# Patient Record
Sex: Female | Born: 1990 | Race: White | Hispanic: No | Marital: Married | Smoking: Never smoker
Health system: Southern US, Community
[De-identification: ages and names within clinical notes are randomized; demographics above are authoritative.]

## PROBLEM LIST (undated history)

## (undated) DIAGNOSIS — Z789 Other specified health status: Secondary | ICD-10-CM

---

## 2013-05-20 HISTORY — PX: WISDOM TOOTH EXTRACTION: SHX21

## 2016-06-03 DIAGNOSIS — Z98891 History of uterine scar from previous surgery: Secondary | ICD-10-CM | POA: Insufficient documentation

## 2018-05-20 NOTE — L&D Delivery Note (Signed)
Delivery Note Pt became complete at 0703 and labored down until 0830 prior to pushing. At 9:03 AM a viable female was delivered en caul with rupture simultaneous with delivery for MSF, via VBAC, Spontaneous (Presentation: ROA).  APGAR: 8, 9; weight: 3715gm (8lb 3oz).   Placenta status: spont, intact.  Cord: 3 vessel  Anesthesia:  Epidural Episiotomy: None Lacerations: 2nd degree;Vaginal;Labial Suture Repair: 3.0 monocryl Est. Blood Loss (mL): 743 (mostly from the lacerations, not uterine atony)  Mom to postpartum.  Baby to Couplet care / Skin to Skin.  Myrtis Ser CNM 02/18/2019, 9:44 AM  Please schedule this patient for Postpartum visit in: 4 weeks- can be virtual with the following provider: Any provider For C/S patients schedule nurse incision check in weeks 2 weeks: no High risk pregnancy complicated by: TOLAC Delivery mode:  SVD Anticipated Birth Control:  other/unsure PP Procedures needed: none  Schedule Integrated BH visit: no

## 2018-07-23 ENCOUNTER — Other Ambulatory Visit: Payer: Self-pay

## 2018-07-23 ENCOUNTER — Encounter: Payer: Self-pay | Admitting: Family Medicine

## 2018-07-23 ENCOUNTER — Ambulatory Visit (INDEPENDENT_AMBULATORY_CARE_PROVIDER_SITE_OTHER): Payer: Medicaid Other | Admitting: *Deleted

## 2018-07-23 DIAGNOSIS — Z3201 Encounter for pregnancy test, result positive: Secondary | ICD-10-CM

## 2018-07-23 DIAGNOSIS — Z3200 Encounter for pregnancy test, result unknown: Secondary | ICD-10-CM

## 2018-07-23 LAB — POCT PREGNANCY, URINE: Preg Test, Ur: POSITIVE — AB

## 2018-07-23 NOTE — Progress Notes (Signed)
Here for pregnancy test which was positive. LMP was 05/17/18 which makes her [redacted]w[redacted]d with EDD 02/21/19. Is sure of her period date and has had regular periods last few months.  She is already taking prenatal vitamins. Advised her to start prenatal care. - she is possible interested in our office or [place] office- sent to registrar to schedule.  Heydi Swango,RN

## 2018-07-24 NOTE — Progress Notes (Signed)
I have reviewed this chart and agree with the RN/CMA assessment and management.    K. Meryl Santina Trillo, M.D. Attending [facility] (Faculty Practice)   

## 2018-08-04 ENCOUNTER — Ambulatory Visit (INDEPENDENT_AMBULATORY_CARE_PROVIDER_SITE_OTHER): Payer: Medicaid Other | Admitting: Advanced Practice Midwife

## 2018-08-04 ENCOUNTER — Other Ambulatory Visit: Payer: Self-pay

## 2018-08-04 ENCOUNTER — Encounter: Payer: Self-pay | Admitting: Advanced Practice Midwife

## 2018-08-04 ENCOUNTER — Other Ambulatory Visit (HOSPITAL_COMMUNITY)
Admission: RE | Admit: 2018-08-04 | Discharge: 2018-08-04 | Disposition: A | Discharge status: Home / Self Care | Payer: Medicaid Other | Source: Ambulatory Visit | Attending: Obstetrics & Gynecology | Admitting: Obstetrics & Gynecology

## 2018-08-04 DIAGNOSIS — Z98891 History of uterine scar from previous surgery: Secondary | ICD-10-CM | POA: Insufficient documentation

## 2018-08-04 DIAGNOSIS — Z3481 Encounter for supervision of other normal pregnancy, first trimester: Secondary | ICD-10-CM

## 2018-08-04 DIAGNOSIS — Z3480 Encounter for supervision of other normal pregnancy, unspecified trimester: Secondary | ICD-10-CM | POA: Insufficient documentation

## 2018-08-04 DIAGNOSIS — Z3A11 11 weeks gestation of pregnancy: Secondary | ICD-10-CM | POA: Diagnosis not present

## 2018-08-04 DIAGNOSIS — O0993 Supervision of high risk pregnancy, unspecified, third trimester: Secondary | ICD-10-CM | POA: Insufficient documentation

## 2018-08-04 LAB — POCT URINALYSIS DIPSTICK OB
Blood, UA: NEGATIVE
Glucose, UA: NEGATIVE
Ketones, UA: NEGATIVE
Nitrite, UA: NEGATIVE
Spec Grav, UA: 1.015 (ref 1.010–1.025)
pH, UA: 6 (ref 5.0–8.0)

## 2018-08-04 NOTE — Progress Notes (Signed)
DATING AND VIABILITY SONOGRAM   Rebecca [name] is a 28 y.o. year old G2P1001 with LMP Patient's last menstrual period was 05/17/2018 (exact date). which would correlate to  [redacted]w[redacted]d weeks gestation.  She has regular menstrual cycles.   She is here today for a confirmatory initial sonogram.    GESTATION: SINGLETON     FETAL ACTIVITY:          Heart rate       166          The fetus is active.    ADNEXA: The ovaries are normal.   GESTATIONAL AGE AND  BIOMETRICS:  Gestational criteria: Estimated Date of Delivery: 02/21/19 by LMP now at [redacted]w[redacted]d  Previous Scans:0      CROWN RUMP LENGTH           4.52 cm         11-2 weeks                                                                               AVERAGE EGA(BY THIS SCAN):  11-2 weeks  WORKING EDD( LMP ):  02-21-2019     TECHNICIAN COMMENTS: Patient informed that the ultrasound is considered a limited obstetric ultrasound and is not intended to be a complete ultrasound exam. Patient also informed that the ultrasound is not being completed with the intent of assessing for fetal or placental anomalies or any pelvic abnormalities. Explained that the purpose of today's ultrasound is to assess for fetal heart rate. Patient acknowledges the purpose of the exam and the limitations of the study.    Armandina Stammer 08/04/2018 10:18 AM

## 2018-08-04 NOTE — Progress Notes (Signed)
  Subjective:    Rebecca [name] is a G2P1001 [redacted]w[redacted]d being seen today for her first obstetrical visit.  Her obstetrical history is significant for History of Low Transverse Cesarean Delivery first pregnancy for FTP past 5cm.  Wants Trial of labor after Cesarean.  States got to 5cm and cervix swelled up.  States did not know she could have refused C/S at the time.   Patient does intend to breast feed. Pregnancy history fully reviewed.  Patient reports no complaints.  Vitals:   08/04/18 0940 08/04/18 0951  BP: 111/80   Pulse: 81   Weight: 64 kg   Height:  5\' 6"  (1.676 m)    HISTORY: OB History  Gravida Para Term Preterm AB Living  2 1 1     1   SAB TAB Ectopic Multiple Live Births          1    # Outcome Date GA Lbr Len/2nd Weight Sex Delivery Anes PTL Lv  2 Current           1 Term 2017 [redacted]w[redacted]d   F CS-LTranv EPI N LIV   History reviewed. No pertinent past medical history. Past Surgical History:  Procedure Laterality Date  . CESAREAN SECTION     Family History  Problem Relation Age of Onset  . Hypertension Mother   . Pulmonary embolism Father   . Hypertension Father      Exam    Uterus:     Pelvic Exam:    Perineum: No Hemorrhoids, Normal Perineum   Vulva: Bartholin's, Urethra, Skene's normal   Vagina:  normal mucosa, normal discharge   pH:    Cervix: no bleeding following Pap   Adnexa: no mass, fullness, tenderness   Bony Pelvis: gynecoid  System: Breast:  normal appearance, no masses or tenderness   Skin: normal coloration and turgor, no rashes    Neurologic: oriented, grossly non-focal   Extremities: normal strength, tone, and muscle mass   HEENT neck supple with midline trachea   Mouth/Teeth mucous membranes moist, pharynx normal without lesions   Neck supple and no masses   Cardiovascular: regular rate and rhythm   Respiratory:  appears well, vitals normal, no respiratory distress, acyanotic, normal RR, ear and throat exam is normal, neck free of mass or  lymphadenopathy, chest clear, no wheezing, crepitations, rhonchi, normal symmetric air entry   Abdomen: soft, non-tender; bowel sounds normal; no masses,  no organomegaly   Urinary: urethral meatus normal      Assessment:    Pregnancy: G2P1001 Patient Active Problem List   Diagnosis Date Noted  . Supervision of other normal pregnancy, antepartum 08/04/2018  . Hx of cesarean section 08/04/2018        Plan:     Initial labs drawn. Prenatal vitamins. Problem list reviewed and updated. Genetic Screening discussed First Screen: Wants to do [place].  Ultrasound discussed; fetal survey: requested.  Follow up in 4 weeks. 50% of 30 min visit spent on counseling and coordination of care.    Welcomed to practice Reviewed nature of practice, [facility] and [facility] Prefers CNM care Wants to do [place].  Discussed TOLAC is a contraindication.  Would like low-intervention birth.  Wants to try TOLAC this pregnancy Routines reviewed   Wynelle Bourgeois 08/04/2018

## 2018-08-04 NOTE — Patient Instructions (Signed)
First Trimester of Pregnancy  The first trimester of pregnancy is from week 1 until the end of week 13 (months 1 through 3). A week after a sperm fertilizes an egg, the egg will implant on the wall of the uterus. This embryo will begin to develop into a baby. Genes from you and your partner will form the baby. The female genes will determine whether the baby will be a boy or a girl. At 6-8 weeks, the eyes and face will be formed, and the heartbeat can be seen on ultrasound. At the end of 12 weeks, all the baby's organs will be formed.  Now that you are pregnant, you will want to do everything you can to have a healthy baby. Two of the most important things are to get good prenatal care and to follow your health care provider's instructions. Prenatal care is all the medical care you receive before the baby's birth. This care will help prevent, find, and treat any problems during the pregnancy and childbirth.  Body changes during your first trimester  Your body goes through many changes during pregnancy. The changes vary from woman to woman.   You may gain or lose a couple of pounds at first.   You may feel sick to your stomach (nauseous) and you may throw up (vomit). If the vomiting is uncontrollable, call your health care provider.   You may tire easily.   You may develop headaches that can be relieved by medicines. All medicines should be approved by your health care provider.   You may urinate more often. Painful urination may mean you have a bladder infection.   You may develop heartburn as a result of your pregnancy.   You may develop constipation because certain hormones are causing the muscles that push stool through your intestines to slow down.   You may develop hemorrhoids or swollen veins (varicose veins).   Your breasts may begin to grow larger and become tender. Your nipples may stick out more, and the tissue that surrounds them (areola) may become darker.   Your gums may bleed and may be  sensitive to brushing and flossing.   Dark spots or blotches (chloasma, mask of pregnancy) may develop on your face. This will likely fade after the baby is born.   Your menstrual periods will stop.   You may have a loss of appetite.   You may develop cravings for certain kinds of food.   You may have changes in your emotions from day to day, such as being excited to be pregnant or being concerned that something may go wrong with the pregnancy and baby.   You may have more vivid and strange dreams.   You may have changes in your hair. These can include thickening of your hair, rapid growth, and changes in texture. Some women also have hair loss during or after pregnancy, or hair that feels dry or thin. Your hair will most likely return to normal after your baby is born.  What to expect at prenatal visits  During a routine prenatal visit:   You will be weighed to make sure you and the baby are growing normally.   Your blood pressure will be taken.   Your abdomen will be measured to track your baby's growth.   The fetal heartbeat will be listened to between weeks 10 and 14 of your pregnancy.   Test results from any previous visits will be discussed.  Your health care provider may ask you:     How you are feeling.   If you are feeling the baby move.   If you have had any abnormal symptoms, such as leaking fluid, bleeding, severe headaches, or abdominal cramping.   If you are using any tobacco products, including cigarettes, chewing tobacco, and electronic cigarettes.   If you have any questions.  Other tests that may be performed during your first trimester include:   Blood tests to find your blood type and to check for the presence of any previous infections. The tests will also be used to check for low iron levels (anemia) and protein on red blood cells (Rh antibodies). Depending on your risk factors, or if you previously had diabetes during pregnancy, you may have tests to check for high blood sugar  that affects pregnant women (gestational diabetes).   Urine tests to check for infections, diabetes, or protein in the urine.   An ultrasound to confirm the proper growth and development of the baby.   Fetal screens for spinal cord problems (spina bifida) and Down syndrome.   HIV (human immunodeficiency virus) testing. Routine prenatal testing includes screening for HIV, unless you choose not to have this test.   You may need other tests to make sure you and the baby are doing well.  Follow these instructions at home:  Medicines   Follow your health care provider's instructions regarding medicine use. Specific medicines may be either safe or unsafe to take during pregnancy.   Take a prenatal vitamin that contains at least 600 micrograms (mcg) of folic acid.   If you develop constipation, try taking a stool softener if your health care provider approves.  Eating and drinking     Eat a balanced diet that includes fresh fruits and vegetables, whole grains, good sources of protein such as meat, eggs, or tofu, and low-fat dairy. Your health care provider will help you determine the amount of weight gain that is right for you.   Avoid raw meat and uncooked cheese. These carry germs that can cause birth defects in the baby.   Eating four or five small meals rather than three large meals a day may help relieve nausea and vomiting. If you start to feel nauseous, eating a few soda crackers can be helpful. Drinking liquids between meals, instead of during meals, also seems to help ease nausea and vomiting.   Limit foods that are high in fat and processed sugars, such as fried and sweet foods.   To prevent constipation:  ? Eat foods that are high in fiber, such as fresh fruits and vegetables, whole grains, and beans.  ? Drink enough fluid to keep your urine clear or pale yellow.  Activity   Exercise only as directed by your health care provider. Most women can continue their usual exercise routine during  pregnancy. Try to exercise for 30 minutes at least 5 days a week. Exercising will help you:  ? Control your weight.  ? Stay in shape.  ? Be prepared for labor and delivery.   Experiencing pain or cramping in the lower abdomen or lower back is a good sign that you should stop exercising. Check with your health care provider before continuing with normal exercises.   Try to avoid standing for long periods of time. Move your legs often if you must stand in one place for a long time.   Avoid heavy lifting.   Wear low-heeled shoes and practice good posture.   You may continue to have sex unless your health care   provider tells you not to.  Relieving pain and discomfort   Wear a good support bra to relieve breast tenderness.   Take warm sitz baths to soothe any pain or discomfort caused by hemorrhoids. Use hemorrhoid cream if your health care provider approves.   Rest with your legs elevated if you have leg cramps or low back pain.   If you develop varicose veins in your legs, wear support hose. Elevate your feet for 15 minutes, 3-4 times a day. Limit salt in your diet.  Prenatal care   Schedule your prenatal visits by the twelfth week of pregnancy. They are usually scheduled monthly at first, then more often in the last 2 months before delivery.   Write down your questions. Take them to your prenatal visits.   Keep all your prenatal visits as told by your health care provider. This is important.  Safety   Wear your seat belt at all times when driving.   Make a list of emergency phone numbers, including numbers for family, friends, the hospital, and police and fire departments.  General instructions   Ask your health care provider for a referral to a local prenatal education class. Begin classes no later than the beginning of month 6 of your pregnancy.   Ask for help if you have counseling or nutritional needs during pregnancy. Your health care provider can offer advice or refer you to specialists for help  with various needs.   Do not use hot tubs, steam rooms, or saunas.   Do not douche or use tampons or scented sanitary pads.   Do not cross your legs for long periods of time.   Avoid cat litter boxes and soil used by cats. These carry germs that can cause birth defects in the baby and possibly loss of the fetus by miscarriage or stillbirth.   Avoid all smoking, herbs, alcohol, and medicines not prescribed by your health care provider. Chemicals in these products affect the formation and growth of the baby.   Do not use any products that contain nicotine or tobacco, such as cigarettes and e-cigarettes. If you need help quitting, ask your health care provider. You may receive counseling support and other resources to help you quit.   Schedule a dentist appointment. At home, brush your teeth with a soft toothbrush and be gentle when you floss.  Contact a health care provider if:   You have dizziness.   You have mild pelvic cramps, pelvic pressure, or nagging pain in the abdominal area.   You have persistent nausea, vomiting, or diarrhea.   You have a bad smelling vaginal discharge.   You have pain when you urinate.   You notice increased swelling in your face, hands, legs, or ankles.   You are exposed to fifth disease or chickenpox.   You are exposed to German measles (rubella) and have never had it.  Get help right away if:   You have a fever.   You are leaking fluid from your vagina.   You have spotting or bleeding from your vagina.   You have severe abdominal cramping or pain.   You have rapid weight gain or loss.   You vomit blood or material that looks like coffee grounds.   You develop a severe headache.   You have shortness of breath.   You have any kind of trauma, such as from a fall or a car accident.  Summary   The first trimester of pregnancy is from week 1 until   the end of week 13 (months 1 through 3).   Your body goes through many changes during pregnancy. The changes vary from  woman to woman.   You will have routine prenatal visits. During those visits, your health care provider will examine you, discuss any test results you may have, and talk with you about how you are feeling.  This information is not intended to replace advice given to you by your health care provider. Make sure you discuss any questions you have with your health care provider.  Document Released: 04/30/2001 Document Revised: 04/17/2016 Document Reviewed: 04/17/2016  Elsevier Interactive Patient Education  2019 [employer]

## 2018-08-05 ENCOUNTER — Encounter: Payer: Self-pay | Admitting: Obstetrics & Gynecology

## 2018-08-06 LAB — CYTOLOGY - PAP
Chlamydia: NEGATIVE
DIAGNOSIS: NEGATIVE
Neisseria Gonorrhea: NEGATIVE

## 2018-08-06 LAB — URINE CULTURE, OB REFLEX

## 2018-08-06 LAB — CULTURE, OB URINE

## 2018-08-13 LAB — OBSTETRIC PANEL, INCLUDING HIV
Antibody Screen: NEGATIVE
Basophils Absolute: 0 10*3/uL (ref 0.0–0.2)
Basos: 0 %
EOS (ABSOLUTE): 0.1 10*3/uL (ref 0.0–0.4)
Eos: 2 %
HIV Screen 4th Generation wRfx: NONREACTIVE
Hematocrit: 39.6 % (ref 34.0–46.6)
Hemoglobin: 13.8 g/dL (ref 11.1–15.9)
Hepatitis B Surface Ag: NEGATIVE
Immature Grans (Abs): 0 10*3/uL (ref 0.0–0.1)
Immature Granulocytes: 0 %
LYMPHS ABS: 2.1 10*3/uL (ref 0.7–3.1)
Lymphs: 30 %
MCH: 31.1 pg (ref 26.6–33.0)
MCHC: 34.8 g/dL (ref 31.5–35.7)
MCV: 89 fL (ref 79–97)
Monocytes Absolute: 0.4 10*3/uL (ref 0.1–0.9)
Monocytes: 6 %
Neutrophils Absolute: 4.3 10*3/uL (ref 1.4–7.0)
Neutrophils: 62 %
PLATELETS: 256 10*3/uL (ref 150–450)
RBC: 4.44 x10E6/uL (ref 3.77–5.28)
RDW: 12.4 % (ref 11.7–15.4)
RPR: NONREACTIVE
RUBELLA: 3.8 {index} (ref >0.99)
Rh Factor: NEGATIVE
WBC: 7 10*3/uL (ref 3.4–10.8)

## 2018-08-13 LAB — CYSTIC FIBROSIS GENE TEST

## 2018-08-13 LAB — SMN1 COPY NUMBER ANALYSIS (SMA CARRIER SCREENING)

## 2018-08-14 ENCOUNTER — Encounter: Payer: Self-pay | Admitting: Advanced Practice Midwife

## 2018-08-14 DIAGNOSIS — Z6791 Unspecified blood type, Rh negative: Secondary | ICD-10-CM | POA: Insufficient documentation

## 2018-08-14 DIAGNOSIS — O26899 Other specified pregnancy related conditions, unspecified trimester: Secondary | ICD-10-CM

## 2018-09-01 ENCOUNTER — Ambulatory Visit (INDEPENDENT_AMBULATORY_CARE_PROVIDER_SITE_OTHER): Payer: Medicaid Other | Admitting: Advanced Practice Midwife

## 2018-09-01 ENCOUNTER — Encounter: Payer: Self-pay | Admitting: Advanced Practice Midwife

## 2018-09-01 VITALS — Wt 145.0 lb

## 2018-09-01 DIAGNOSIS — Z3480 Encounter for supervision of other normal pregnancy, unspecified trimester: Secondary | ICD-10-CM | POA: Diagnosis not present

## 2018-09-01 DIAGNOSIS — Z3482 Encounter for supervision of other normal pregnancy, second trimester: Secondary | ICD-10-CM

## 2018-09-01 DIAGNOSIS — Z3A15 15 weeks gestation of pregnancy: Secondary | ICD-10-CM

## 2018-09-01 NOTE — Progress Notes (Signed)
Patient will come pick up bp cuff this afternoon. Armandina Stammer RN

## 2018-09-01 NOTE — Patient Instructions (Signed)

## 2018-09-01 NOTE — Progress Notes (Signed)
   PRENATAL VISIT NOTE  TELEHEALTH VISIT I connected with  this patient who is [redacted]w[redacted]d   on 09/01/18 by a Telephone telemedicine application and verified that I am speaking with the correct person using two identifiers.   I discussed the limitations of evaluation and management by telemedicine. The patient expressed understanding and agreed to proceed  Patient was at home I was in office. Armandina Stammer RN initiated call Total time spent 7 minutes..   Subjective:  Rebecca [name] is a 28 y.o. G2P1001 at [redacted]w[redacted]d being seen today for ongoing prenatal care.  She is currently monitored for the following issues for this low-risk pregnancy and has Supervision of other normal pregnancy, antepartum; Hx of cesarean section; and Rh negative state in antepartum period on their problem list.  Patient reports no complaints.  Contractions: Not present. Vag. Bleeding: None.   . Denies leaking of fluid.   The following portions of the patient's history were reviewed and updated as appropriate: allergies, current medications, past family history, past medical history, past social history, past surgical history and problem list.   Objective:   Vitals:   09/01/18 1009  Weight: 65.8 kg    Fetal Status:           General:  Alert, oriented and cooperative. Patient is in no acute distress.  Skin: Skin is warm and dry. No rash noted.   Cardiovascular: Normal heart rate noted  Respiratory: Normal respiratory effort, no problems with respiration noted  Abdomen: Soft, gravid, appropriate for gestational age.  Pain/Pressure: Absent     Pelvic: Cervical exam deferred        Extremities: Normal range of motion.  Edema: None  Mental Status: Normal mood and affect. Normal behavior. Normal judgment and thought content.   Assessment and Plan:  Pregnancy: G2P1001 at [redacted]w[redacted]d 1. Supervision of other normal pregnancy, antepartum      Reviewed normal changes and developments for this stage of pregnancy       Discussed  round ligament pain (has had a few) and went over signs to report.     Scheduled for anatomy US, reviewed new location.  - AFP, Serum, Open Spina Bifida        Will come in for AFP only draw, obtain BP cuff, and sign ROI for C/S op note  Preterm labor symptoms and general obstetric precautions including but not limited to vaginal bleeding, contractions, leaking of fluid and fetal movement were reviewed in detail with the patient. Please refer to After Visit Summary for other counseling recommendations.   Return in about 4 weeks (around 09/29/2018) for Telehealth.  Future Appointments  Date Time Provider Department Center  09/21/2018 10:45 AM Levie Heritage, DO [facility] None  09/28/2018 10:45 AM [facility] [place] 2 [facility]  09/28/2018 10:50 AM [facility] NURSE [facility]    Wynelle Bourgeois, CNM

## 2018-09-03 LAB — AFP, SERUM, OPEN SPINA BIFIDA
AFP MoM: 1.05
AFP Value: 32.4 ng/mL
Gest. Age on Collection Date: 15.3 weeks
Maternal Age At EDD: 29.6 yr
OSBR Risk 1 IN: 10000
Test Results:: NEGATIVE
Weight: 145 [lb_av]

## 2018-09-21 ENCOUNTER — Other Ambulatory Visit: Payer: Self-pay

## 2018-09-21 ENCOUNTER — Ambulatory Visit (INDEPENDENT_AMBULATORY_CARE_PROVIDER_SITE_OTHER): Payer: Medicaid Other | Admitting: Family Medicine

## 2018-09-21 ENCOUNTER — Encounter: Payer: Self-pay | Admitting: Family Medicine

## 2018-09-21 VITALS — BP 121/83 | HR 89 | Wt 150.0 lb

## 2018-09-21 DIAGNOSIS — O26892 Other specified pregnancy related conditions, second trimester: Secondary | ICD-10-CM

## 2018-09-21 DIAGNOSIS — Z98891 History of uterine scar from previous surgery: Secondary | ICD-10-CM

## 2018-09-21 DIAGNOSIS — O360920 Maternal care for other rhesus isoimmunization, second trimester, not applicable or unspecified: Secondary | ICD-10-CM

## 2018-09-21 DIAGNOSIS — Z3A18 18 weeks gestation of pregnancy: Secondary | ICD-10-CM

## 2018-09-21 DIAGNOSIS — Z6791 Unspecified blood type, Rh negative: Secondary | ICD-10-CM

## 2018-09-21 DIAGNOSIS — O26899 Other specified pregnancy related conditions, unspecified trimester: Secondary | ICD-10-CM

## 2018-09-21 DIAGNOSIS — Z3480 Encounter for supervision of other normal pregnancy, unspecified trimester: Secondary | ICD-10-CM

## 2018-09-21 LAB — POCT URINALYSIS DIPSTICK OB
Blood, UA: NEGATIVE
Glucose, UA: NEGATIVE
Spec Grav, UA: 1.015 (ref 1.010–1.025)
pH, UA: 6.5 (ref 5.0–8.0)

## 2018-09-21 NOTE — Progress Notes (Signed)
   PRENATAL VISIT NOTE  Subjective:  Rebecca [name] is a 28 y.o. G2P1001 at [redacted]w[redacted]d being seen today for ongoing prenatal care.  She is currently monitored for the following issues for this low-risk pregnancy and has Supervision of other normal pregnancy, antepartum; Hx of cesarean section; and Rh negative state in antepartum period on their problem list.  Patient reports no complaints.  Contractions: Not present. Vag. Bleeding: None.  Movement: Present. Denies leaking of fluid.   The following portions of the patient's history were reviewed and updated as appropriate: allergies, current medications, past family history, past medical history, past social history, past surgical history and problem list.   Objective:   Vitals:   09/21/18 1046  BP: 121/83  Pulse: 89  Weight: 150 lb (68 kg)    Fetal Status: Fetal Heart Rate (bpm): 150 Fundal Height: 19 cm Movement: Present     General:  Alert, oriented and cooperative. Patient is in no acute distress.  Skin: Skin is warm and dry. No rash noted.   Cardiovascular: Normal heart rate noted  Respiratory: Normal respiratory effort, no problems with respiration noted  Abdomen: Soft, gravid, appropriate for gestational age.  Pain/Pressure: Present     Pelvic: Cervical exam deferred        Extremities: Normal range of motion.  Edema: None  Mental Status: Normal mood and affect. Normal behavior. Normal judgment and thought content.   Assessment and Plan:  Pregnancy: G2P1001 at [redacted]w[redacted]d 1. Supervision of other normal pregnancy, antepartum FHT and FH normal, 28 week labs next appt  2. Rh negative state in antepartum period Rhogam at 28 weeks  3. Hx of cesarean section Discussed TOLAC - patient would like TOLAC. Will get op note.  Preterm labor symptoms and general obstetric precautions including but not limited to vaginal bleeding, contractions, leaking of fluid and fetal movement were reviewed in detail with the patient. Please refer to After  Visit Summary for other counseling recommendations.   Return in about 8 weeks (around 11/16/2018) for OB f/u, in office.  Future Appointments  Date Time Provider Department Center  09/28/2018 10:45 AM [facility] [place] 2 [facility]  09/28/2018 10:50 AM [facility] NURSE [facility]    Levie Heritage, DO

## 2018-09-28 ENCOUNTER — Other Ambulatory Visit (HOSPITAL_COMMUNITY): Payer: Self-pay | Admitting: *Deleted

## 2018-09-28 ENCOUNTER — Other Ambulatory Visit: Payer: Self-pay

## 2018-09-28 ENCOUNTER — Ambulatory Visit (HOSPITAL_COMMUNITY)
Admission: RE | Admit: 2018-09-28 | Discharge: 2018-09-28 | Disposition: A | Discharge status: Home / Self Care | Payer: Medicaid Other | Source: Ambulatory Visit | Attending: Obstetrics and Gynecology | Admitting: Obstetrics and Gynecology

## 2018-09-28 ENCOUNTER — Ambulatory Visit (HOSPITAL_COMMUNITY): Payer: Medicaid Other

## 2018-09-28 ENCOUNTER — Other Ambulatory Visit: Payer: Self-pay | Admitting: Advanced Practice Midwife

## 2018-09-28 DIAGNOSIS — Z3480 Encounter for supervision of other normal pregnancy, unspecified trimester: Secondary | ICD-10-CM | POA: Diagnosis not present

## 2018-09-28 DIAGNOSIS — O34219 Maternal care for unspecified type scar from previous cesarean delivery: Secondary | ICD-10-CM | POA: Diagnosis not present

## 2018-09-28 DIAGNOSIS — Z363 Encounter for antenatal screening for malformations: Secondary | ICD-10-CM

## 2018-09-28 DIAGNOSIS — Z98891 History of uterine scar from previous surgery: Secondary | ICD-10-CM

## 2018-09-28 DIAGNOSIS — Z3A19 19 weeks gestation of pregnancy: Secondary | ICD-10-CM

## 2018-09-28 DIAGNOSIS — O360120 Maternal care for anti-D [Rh] antibodies, second trimester, not applicable or unspecified: Secondary | ICD-10-CM

## 2018-09-28 DIAGNOSIS — Z362 Encounter for other antenatal screening follow-up: Secondary | ICD-10-CM

## 2018-11-09 ENCOUNTER — Ambulatory Visit (HOSPITAL_COMMUNITY)
Admission: RE | Admit: 2018-11-09 | Discharge: 2018-11-09 | Disposition: A | Discharge status: Home / Self Care | Payer: Medicaid Other | Source: Ambulatory Visit | Attending: Obstetrics and Gynecology | Admitting: Obstetrics and Gynecology

## 2018-11-09 ENCOUNTER — Other Ambulatory Visit: Payer: Self-pay

## 2018-11-09 DIAGNOSIS — Z3A25 25 weeks gestation of pregnancy: Secondary | ICD-10-CM

## 2018-11-09 DIAGNOSIS — O34219 Maternal care for unspecified type scar from previous cesarean delivery: Secondary | ICD-10-CM | POA: Diagnosis not present

## 2018-11-09 DIAGNOSIS — Z362 Encounter for other antenatal screening follow-up: Secondary | ICD-10-CM | POA: Diagnosis not present

## 2018-11-09 DIAGNOSIS — O360190 Maternal care for anti-D [Rh] antibodies, unspecified trimester, not applicable or unspecified: Secondary | ICD-10-CM

## 2018-11-26 ENCOUNTER — Other Ambulatory Visit: Payer: Self-pay

## 2018-11-26 ENCOUNTER — Ambulatory Visit (INDEPENDENT_AMBULATORY_CARE_PROVIDER_SITE_OTHER): Payer: Medicaid Other | Admitting: Family Medicine

## 2018-11-26 VITALS — BP 113/76 | HR 96 | Wt 161.0 lb

## 2018-11-26 DIAGNOSIS — Z3480 Encounter for supervision of other normal pregnancy, unspecified trimester: Secondary | ICD-10-CM | POA: Diagnosis not present

## 2018-11-26 DIAGNOSIS — Z3482 Encounter for supervision of other normal pregnancy, second trimester: Secondary | ICD-10-CM

## 2018-11-26 DIAGNOSIS — Z3A27 27 weeks gestation of pregnancy: Secondary | ICD-10-CM

## 2018-11-26 NOTE — Progress Notes (Signed)
.     PRENATAL VISIT NOTE  Subjective:  Rebecca [name] is a 28 y.o. G2P1001 at [redacted]w[redacted]d being seen today for ongoing prenatal care.  She is currently monitored for the following issues for this low-risk pregnancy and has Supervision of other normal pregnancy, antepartum; Hx of cesarean section; and Rh negative state in antepartum period on their problem list.  Patient reports no complaints.  Contractions: Not present. Vag. Bleeding: None.  Movement: Present. Denies leaking of fluid.   The following portions of the patient's history were reviewed and updated as appropriate: allergies, current medications, past family history, past medical history, past social history, past surgical history and problem list.   Objective:   Vitals:   11/26/18 0823  BP: 113/76  Pulse: 96  Weight: 161 lb 0.6 oz (73 kg)    Fetal Status: Fetal Heart Rate (bpm): 145   Movement: Present     General:  Alert, oriented and cooperative. Patient is in no acute distress.  Skin: Skin is warm and dry. No rash noted.   Cardiovascular: Normal heart rate noted  Respiratory: Normal respiratory effort, no problems with respiration noted  Abdomen: Soft, gravid, appropriate for gestational age.  Pain/Pressure: Present     Pelvic: Cervical exam deferred        Extremities: Normal range of motion.  Edema: None  Mental Status: Normal mood and affect. Normal behavior. Normal judgment and thought content.   Assessment and Plan:  Pregnancy: G2P1001 at [redacted]w[redacted]d 1. Supervision of other normal pregnancy, antepartum FHT and FH normal - CBC - Glucose Tolerance, 2 Hours w/1 Hour - HIV Antibody (routine testing w rflx) - RPR  Preterm labor symptoms and general obstetric precautions including but not limited to vaginal bleeding, contractions, leaking of fluid and fetal movement were reviewed in detail with the patient. Please refer to After Visit Summary for other counseling recommendations.   Return in about 4 weeks (around  12/24/2018) for OB f/u.  No future appointments.  Truett Mainland, DO

## 2018-11-27 LAB — CBC
Hematocrit: 39.3 % (ref 34.0–46.6)
Hemoglobin: 13.3 g/dL (ref 11.1–15.9)
MCH: 31.4 pg (ref 26.6–33.0)
MCHC: 33.8 g/dL (ref 31.5–35.7)
MCV: 93 fL (ref 79–97)
Platelets: 223 10*3/uL (ref 150–450)
RBC: 4.23 x10E6/uL (ref 3.77–5.28)
RDW: 12.4 % (ref 11.7–15.4)
WBC: 7.8 10*3/uL (ref 3.4–10.8)

## 2018-11-27 LAB — GLUCOSE TOLERANCE, 2 HOURS W/ 1HR
Glucose, 1 hour: 96 mg/dL (ref 65–179)
Glucose, 2 hour: 85 mg/dL (ref 65–152)
Glucose, Fasting: 72 mg/dL (ref 65–91)

## 2018-11-27 LAB — HIV ANTIBODY (ROUTINE TESTING W REFLEX): HIV Screen 4th Generation wRfx: NONREACTIVE

## 2018-11-27 LAB — RPR: RPR Ser Ql: NONREACTIVE

## 2018-12-24 ENCOUNTER — Encounter: Payer: Medicaid Other | Admitting: Obstetrics & Gynecology

## 2018-12-25 ENCOUNTER — Ambulatory Visit (INDEPENDENT_AMBULATORY_CARE_PROVIDER_SITE_OTHER): Payer: Medicaid Other | Admitting: Obstetrics & Gynecology

## 2018-12-25 ENCOUNTER — Other Ambulatory Visit: Payer: Self-pay

## 2018-12-25 VITALS — BP 114/79 | HR 94 | Wt 167.0 lb

## 2018-12-25 DIAGNOSIS — Z3A31 31 weeks gestation of pregnancy: Secondary | ICD-10-CM | POA: Diagnosis not present

## 2018-12-25 DIAGNOSIS — Z98891 History of uterine scar from previous surgery: Secondary | ICD-10-CM

## 2018-12-25 DIAGNOSIS — O360130 Maternal care for anti-D [Rh] antibodies, third trimester, not applicable or unspecified: Secondary | ICD-10-CM | POA: Diagnosis not present

## 2018-12-25 DIAGNOSIS — Z3480 Encounter for supervision of other normal pregnancy, unspecified trimester: Secondary | ICD-10-CM

## 2018-12-25 DIAGNOSIS — Z6791 Unspecified blood type, Rh negative: Secondary | ICD-10-CM

## 2018-12-25 MED ORDER — RHO D IMMUNE GLOBULIN 1500 UNIT/2ML IJ SOSY
300.0000 ug | PREFILLED_SYRINGE | Freq: Once | INTRAMUSCULAR | Status: AC
  Administered 2018-12-25: 09:00:00 300 ug via INTRAMUSCULAR

## 2018-12-25 NOTE — Progress Notes (Signed)
   PRENATAL VISIT NOTE  Subjective:  Rebecca [name] is a 28 y.o. G2P1001 at [redacted]w[redacted]d being seen today for ongoing prenatal care.  She is currently monitored for the following issues for this high-risk pregnancy and has Supervision of other normal pregnancy, antepartum; Hx of cesarean section; and Rh negative state in antepartum period on their problem list.  Patient reports no complaints.  Contractions: Not present. Vag. Bleeding: None.  Movement: Present. Denies leaking of fluid.   The following portions of the patient's history were reviewed and updated as appropriate: allergies, current medications, past family history, past medical history, past social history, past surgical history and problem list.   Objective:   Vitals:   12/25/18 0839  BP: 114/79  Pulse: 94  Weight: 167 lb (75.8 kg)    Fetal Status: Fetal Heart Rate (bpm): 145   Movement: Present     General:  Alert, oriented and cooperative. Patient is in no acute distress.  Skin: Skin is warm and dry. No rash noted.   Cardiovascular: Normal heart rate noted  Respiratory: Normal respiratory effort, no problems with respiration noted  Abdomen: Soft, gravid, appropriate for gestational age.  Pain/Pressure: Present     Pelvic: Cervical exam deferred        Extremities: Normal range of motion.  Edema: None  Mental Status: Normal mood and affect. Normal behavior. Normal judgment and thought content.   Assessment and Plan:  Pregnancy: G2P1001 at [redacted]w[redacted]d 1. Supervision of other normal pregnancy, antepartum FH and FHR WNL  2. Rh negative state in antepartum period Rhogam today  3. Hx of cesarean section Desires TOLAC   Preterm labor symptoms and general obstetric precautions including but not limited to vaginal bleeding, contractions, leaking of fluid and fetal movement were reviewed in detail with the patient. Please refer to After Visit Summary for other counseling recommendations.   Return in about 2 weeks (around  01/08/2019).  No future appointments.  Lavonia Drafts, MD

## 2018-12-25 NOTE — Patient Instructions (Signed)
Third Trimester of Pregnancy The third trimester is from week 28 through week 40 (months 7 through 9). The third trimester is a time when the unborn baby (fetus) is growing rapidly. At the end of the ninth month, the fetus is about 20 inches in length and weighs 6-10 pounds. Body changes during your third trimester Your body will continue to go through many changes during pregnancy. The changes vary from woman to woman. During the third trimester:  Your weight will continue to increase. You can expect to gain 25-35 pounds (11-16 kg) by the end of the pregnancy.  You may begin to get stretch marks on your hips, abdomen, and breasts.  You may urinate more often because the fetus is moving lower into your pelvis and pressing on your bladder.  You may develop or continue to have heartburn. This is caused by increased hormones that slow down muscles in the digestive tract.  You may develop or continue to have constipation because increased hormones slow digestion and cause the muscles that push waste through your intestines to relax.  You may develop hemorrhoids. These are swollen veins (varicose veins) in the rectum that can itch or be painful.  You may develop swollen, bulging veins (varicose veins) in your legs.  You may have increased body aches in the pelvis, back, or thighs. This is due to weight gain and increased hormones that are relaxing your joints.  You may have changes in your hair. These can include thickening of your hair, rapid growth, and changes in texture. Some women also have hair loss during or after pregnancy, or hair that feels dry or thin. Your hair will most likely return to normal after your baby is born.  Your breasts will continue to grow and they will continue to become tender. A yellow fluid (colostrum) may leak from your breasts. This is the first milk you are producing for your baby.  Your belly button may stick out.  You may notice more swelling in your hands,  face, or ankles.  You may have increased tingling or numbness in your hands, arms, and legs. The skin on your belly may also feel numb.  You may feel short of breath because of your expanding uterus.  You may have more problems sleeping. This can be caused by the size of your belly, increased need to urinate, and an increase in your body's metabolism.  You may notice the fetus "dropping," or moving lower in your abdomen (lightening).  You may have increased vaginal discharge.  You may notice your joints feel loose and you may have pain around your pelvic bone. What to expect at prenatal visits You will have prenatal exams every 2 weeks until week 36. Then you will have weekly prenatal exams. During a routine prenatal visit:  You will be weighed to make sure you and the baby are growing normally.  Your blood pressure will be taken.  Your abdomen will be measured to track your baby's growth.  The fetal heartbeat will be listened to.  Any test results from the previous visit will be discussed.  You may have a cervical check near your due date to see if your cervix has softened or thinned (effaced).  You will be tested for Group B streptococcus. This happens between 35 and 37 weeks. Your health care provider may ask you:  What your birth plan is.  How you are feeling.  If you are feeling the baby move.  If you have had any abnormal   symptoms, such as leaking fluid, bleeding, severe headaches, or abdominal cramping.  If you are using any tobacco products, including cigarettes, chewing tobacco, and electronic cigarettes.  If you have any questions. Other tests or screenings that may be performed during your third trimester include:  Blood tests that check for low iron levels (anemia).  Fetal testing to check the health, activity level, and growth of the fetus. Testing is done if you have certain medical conditions or if there are problems during the pregnancy.  Nonstress test  (NST). This test checks the health of your baby to make sure there are no signs of problems, such as the baby not getting enough oxygen. During this test, a belt is placed around your belly. The baby is made to move, and its heart rate is monitored during movement. What is false labor? False labor is a condition in which you feel small, irregular tightenings of the muscles in the womb (contractions) that usually go away with rest, changing position, or drinking water. These are called Braxton Hicks contractions. Contractions may last for hours, days, or even weeks before true labor sets in. If contractions come at regular intervals, become more frequent, increase in intensity, or become painful, you should see your health care provider. What are the signs of labor?  Abdominal cramps.  Regular contractions that start at 10 minutes apart and become stronger and more frequent with time.  Contractions that start on the top of the uterus and spread down to the lower abdomen and back.  Increased pelvic pressure and dull back pain.  A watery or bloody mucus discharge that comes from the vagina.  Leaking of amniotic fluid. This is also known as your "water breaking." It could be a slow trickle or a gush. Let your health care provider know if it has a color or strange odor. If you have any of these signs, call your health care provider right away, even if it is before your due date. Follow these instructions at home: Medicines  Follow your health care provider's instructions regarding medicine use. Specific medicines may be either safe or unsafe to take during pregnancy.  Take a prenatal vitamin that contains at least 600 micrograms (mcg) of folic acid.  If you develop constipation, try taking a stool softener if your health care provider approves. Eating and drinking   Eat a balanced diet that includes fresh fruits and vegetables, whole grains, good sources of protein such as meat, eggs, or tofu,  and low-fat dairy. Your health care provider will help you determine the amount of weight gain that is right for you.  Avoid raw meat and uncooked cheese. These carry germs that can cause birth defects in the baby.  If you have low calcium intake from food, talk to your health care provider about whether you should take a daily calcium supplement.  Eat four or five small meals rather than three large meals a day.  Limit foods that are high in fat and processed sugars, such as fried and sweet foods.  To prevent constipation: ? Drink enough fluid to keep your urine clear or pale yellow. ? Eat foods that are high in fiber, such as fresh fruits and vegetables, whole grains, and beans. Activity  Exercise only as directed by your health care provider. Most women can continue their usual exercise routine during pregnancy. Try to exercise for 30 minutes at least 5 days a week. Stop exercising if you experience uterine contractions.  Avoid heavy lifting.  Do   not exercise in extreme heat or humidity, or at high altitudes.  Wear low-heel, comfortable shoes.  Practice good posture.  You may continue to have sex unless your health care provider tells you otherwise. Relieving pain and discomfort  Take frequent breaks and rest with your legs elevated if you have leg cramps or low back pain.  Take warm sitz baths to soothe any pain or discomfort caused by hemorrhoids. Use hemorrhoid cream if your health care provider approves.  Wear a good support bra to prevent discomfort from breast tenderness.  If you develop varicose veins: ? Wear support pantyhose or compression stockings as told by your healthcare provider. ? Elevate your feet for 15 minutes, 3-4 times a day. Prenatal care  Write down your questions. Take them to your prenatal visits.  Keep all your prenatal visits as told by your health care provider. This is important. Safety  Wear your seat belt at all times when driving.  Make  a list of emergency phone numbers, including numbers for family, friends, the hospital, and police and fire departments. General instructions  Avoid cat litter boxes and soil used by cats. These carry germs that can cause birth defects in the baby. If you have a cat, ask someone to clean the litter box for you.  Do not travel far distances unless it is absolutely necessary and only with the approval of your health care provider.  Do not use hot tubs, steam rooms, or saunas.  Do not drink alcohol.  Do not use any products that contain nicotine or tobacco, such as cigarettes and e-cigarettes. If you need help quitting, ask your health care provider.  Do not use any medicinal herbs or unprescribed drugs. These chemicals affect the formation and growth of the baby.  Do not douche or use tampons or scented sanitary pads.  Do not cross your legs for long periods of time.  To prepare for the arrival of your baby: ? Take prenatal classes to understand, practice, and ask questions about labor and delivery. ? Make a trial run to the hospital. ? Visit the hospital and tour the maternity area. ? Arrange for maternity or paternity leave through employers. ? Arrange for family and friends to take care of pets while you are in the hospital. ? Purchase a rear-facing car seat and make sure you know how to install it in your car. ? Pack your hospital bag. ? Prepare the baby's nursery. Make sure to remove all pillows and stuffed animals from the baby's crib to prevent suffocation.  Visit your dentist if you have not gone during your pregnancy. Use a soft toothbrush to brush your teeth and be gentle when you floss. Contact a health care provider if:  You are unsure if you are in labor or if your water has broken.  You become dizzy.  You have mild pelvic cramps, pelvic pressure, or nagging pain in your abdominal area.  You have lower back pain.  You have persistent nausea, vomiting, or diarrhea.   You have an unusual or bad smelling vaginal discharge.  You have pain when you urinate. Get help right away if:  Your water breaks before 37 weeks.  You have regular contractions less than 5 minutes apart before 37 weeks.  You have a fever.  You are leaking fluid from your vagina.  You have spotting or bleeding from your vagina.  You have severe abdominal pain or cramping.  You have rapid weight loss or weight gain.  You have   shortness of breath with chest pain.  You notice sudden or extreme swelling of your face, hands, ankles, feet, or legs.  Your baby makes fewer than 10 movements in 2 hours.  You have severe headaches that do not go away when you take medicine.  You have vision changes. Summary  The third trimester is from week 28 through week 40, months 7 through 9. The third trimester is a time when the unborn baby (fetus) is growing rapidly.  During the third trimester, your discomfort may increase as you and your baby continue to gain weight. You may have abdominal, leg, and back pain, sleeping problems, and an increased need to urinate.  During the third trimester your breasts will keep growing and they will continue to become tender. A yellow fluid (colostrum) may leak from your breasts. This is the first milk you are producing for your baby.  False labor is a condition in which you feel small, irregular tightenings of the muscles in the womb (contractions) that eventually go away. These are called Braxton Hicks contractions. Contractions may last for hours, days, or even weeks before true labor sets in.  Signs of labor can include: abdominal cramps; regular contractions that start at 10 minutes apart and become stronger and more frequent with time; watery or bloody mucus discharge that comes from the vagina; increased pelvic pressure and dull back pain; and leaking of amniotic fluid. This information is not intended to replace advice given to you by your health  care provider. Make sure you discuss any questions you have with your health care provider. Document Released: 04/30/2001 Document Revised: 08/27/2018 Document Reviewed: 06/11/2016 Elsevier Patient Education  2020 [employer]  

## 2019-01-12 ENCOUNTER — Encounter: Payer: Self-pay | Admitting: Advanced Practice Midwife

## 2019-01-12 ENCOUNTER — Ambulatory Visit (INDEPENDENT_AMBULATORY_CARE_PROVIDER_SITE_OTHER): Payer: Medicaid Other | Admitting: Advanced Practice Midwife

## 2019-01-12 DIAGNOSIS — R000 Tachycardia, unspecified: Secondary | ICD-10-CM

## 2019-01-12 DIAGNOSIS — O26893 Other specified pregnancy related conditions, third trimester: Secondary | ICD-10-CM

## 2019-01-12 DIAGNOSIS — Z98891 History of uterine scar from previous surgery: Secondary | ICD-10-CM

## 2019-01-12 DIAGNOSIS — O34211 Maternal care for low transverse scar from previous cesarean delivery: Secondary | ICD-10-CM

## 2019-01-12 DIAGNOSIS — Z6791 Unspecified blood type, Rh negative: Secondary | ICD-10-CM

## 2019-01-12 DIAGNOSIS — Z3480 Encounter for supervision of other normal pregnancy, unspecified trimester: Secondary | ICD-10-CM

## 2019-01-12 DIAGNOSIS — Z3A34 34 weeks gestation of pregnancy: Secondary | ICD-10-CM

## 2019-01-12 NOTE — Patient Instructions (Signed)

## 2019-01-12 NOTE — Progress Notes (Signed)
   PRENATAL VISIT NOTE   TELEHEALTH OBSTETRICS PRENATAL VIRTUAL VIDEO VISIT ENCOUNTER NOTE  Provider location: [facility]   I connected with Rebecca [name] on 01/12/19 at 10:30 AM EDT by WebEx Video Encounter at home and verified that I am speaking with the correct person using two identifiers.   I discussed the limitations, risks, security and privacy concerns of performing an evaluation and management service virtually and the availability of in person appointments. I also discussed with the patient that there may be a patient responsible charge related to this service. The patient expressed understanding and agreed to proceed. Subjective:  Rebecca [name] is a 28 y.o. G2P1001 at [redacted]w[redacted]d being seen today for ongoing prenatal care.  She is currently monitored for the following issues for this high-risk pregnancy and has Supervision of other normal pregnancy, antepartum; Hx of cesarean section; Rh negative state in antepartum period; and Tachycardia on their problem list.  Patient reports episodes of tachycardia..  They happen several times a day.  Require her to lie down due to dizziness.  Passed out once.   Contractions: Not present. Vag. Bleeding: None.  Movement: Present. Denies any leaking of fluid.   The following portions of the patient's history were reviewed and updated as appropriate: allergies, current medications, past family history, past medical history, past social history, past surgical history and problem list.   Objective:   Vitals:   01/12/19 1026  BP: 112/72  Pulse: (!) 109    Fetal Status:     Movement: Present     General:  Alert, oriented and cooperative. Patient is in no acute distress.  Respiratory: Normal respiratory effort, no problems with respiration noted  Mental Status: Normal mood and affect. Normal behavior. Normal judgment and thought content.  Rest of physical exam deferred due to type of encounter  Imaging:  No results found.  Assessment and Plan:  Pregnancy: G2P1001 at [redacted]w[redacted]d 1. Hx of cesarean section Reviewed Op Note >> LTCS, 2 layer closure  2. Rh negative state in antepartum period Had Rhophylac 12/25/2018  3. Supervision of other normal pregnancy, antepartum Good FM, no contractions  4. Tachycardia Discussed episodes.   Require her to lie down I recommend Cardiology referral due to it happening frequently and requiring her to lie down, with one episode of syncope.  May need Holter.  Will schedule  Preterm labor symptoms and general obstetric precautions including but not limited to vaginal bleeding, contractions, leaking of fluid and fetal movement were reviewed in detail with the patient. I discussed the assessment and treatment plan with the patient. The patient was provided an opportunity to ask questions and all were answered. The patient agreed with the plan and demonstrated an understanding of the instructions. The patient was advised to call back or seek an in-person office evaluation/go to [facility] at [facility] for any urgent or concerning symptoms. Please refer to After Visit Summary for other counseling recommendations.   I provided 12 minutes of face-to-face time during this encounter.  RTO 2 weeks  Hansel Feinstein, [place] [facility] [employer], [place]

## 2019-01-12 NOTE — Progress Notes (Signed)
Patient agrees to webex visit. Judea Riches RN 

## 2019-01-26 ENCOUNTER — Other Ambulatory Visit (HOSPITAL_COMMUNITY)
Admission: RE | Admit: 2019-01-26 | Discharge: 2019-01-26 | Disposition: A | Discharge status: Home / Self Care | Payer: Medicaid Other | Source: Ambulatory Visit | Attending: Advanced Practice Midwife | Admitting: Advanced Practice Midwife

## 2019-01-26 ENCOUNTER — Other Ambulatory Visit: Payer: Self-pay

## 2019-01-26 ENCOUNTER — Ambulatory Visit (INDEPENDENT_AMBULATORY_CARE_PROVIDER_SITE_OTHER): Payer: Medicaid Other | Admitting: Advanced Practice Midwife

## 2019-01-26 VITALS — BP 117/84 | HR 87 | Wt 176.0 lb

## 2019-01-26 DIAGNOSIS — Z3A36 36 weeks gestation of pregnancy: Secondary | ICD-10-CM

## 2019-01-26 DIAGNOSIS — O339 Maternal care for disproportion, unspecified: Secondary | ICD-10-CM | POA: Diagnosis not present

## 2019-01-26 DIAGNOSIS — O99891 Other specified diseases and conditions complicating pregnancy: Secondary | ICD-10-CM | POA: Insufficient documentation

## 2019-01-26 DIAGNOSIS — O9989 Other specified diseases and conditions complicating pregnancy, childbirth and the puerperium: Secondary | ICD-10-CM

## 2019-01-26 DIAGNOSIS — M549 Dorsalgia, unspecified: Secondary | ICD-10-CM | POA: Insufficient documentation

## 2019-01-26 DIAGNOSIS — Z3483 Encounter for supervision of other normal pregnancy, third trimester: Secondary | ICD-10-CM

## 2019-01-26 MED ORDER — COMFORT FIT MATERNITY SUPP MED MISC: 1.0000 | Freq: Every day | 1 refills | Status: DC

## 2019-01-26 NOTE — Progress Notes (Addendum)
   PRENATAL VISIT NOTE  Subjective:  Rebecca [name] is a 28 y.o. G2P1001 at [redacted]w[redacted]d being seen today for ongoing prenatal care.  She is currently monitored for the following issues for this low-risk pregnancy and has Supervision of other normal pregnancy, antepartum; Hx of cesarean section; Rh negative state in antepartum period; and Tachycardia on their problem list.  Patient reports back pain which is worse when lying down, no further tachycardia.  Contractions: Not present. Vag. Bleeding: None.  Movement: Present. Denies leaking of fluid.   The following portions of the patient's history were reviewed and updated as appropriate: allergies, current medications, past family history, past medical history, past social history, past surgical history and problem list.   Objective:   Vitals:   01/26/19 0830  BP: 117/84  Pulse: 87  Weight: 79.8 kg    Fetal Status:     Movement: Present     General:  Alert, oriented and cooperative. Patient is in no acute distress.  Skin: Skin is warm and dry. No rash noted.   Cardiovascular: Normal heart rate noted  Respiratory: Normal respiratory effort, no problems with respiration noted  Abdomen: Soft, gravid, appropriate for gestational age.  Pain/Pressure: Present     Pelvic: Cervical exam performed      Cx FT/70/-2/vtx FHT 128  Extremities: Normal range of motion.  Edema: None  Mental Status: Normal mood and affect. Normal behavior. Normal judgment and thought content.   Assessment and Plan:  Pregnancy: G2P1001 at [redacted]w[redacted]d 1. Encounter for supervision of other normal pregnancy in third trimester      Cervix favorable - Culture, beta strep (group b only) - Cervicovaginal ancillary only( [place])  2.  Back pain     Rx Support belt  3.   Tachycardia      States has not had any episodes in past 2 weeks      Discussed cardiology consult may be helpful, but pt considering cancelling next week's appt  Term labor symptoms and general obstetric  precautions including but not limited to vaginal bleeding, contractions, leaking of fluid and fetal movement were reviewed in detail with the patient. Please refer to After Visit Summary for other counseling recommendations.     Future Appointments  Date Time Provider [place]  02/03/2019  2:20 PM Berniece Salines, DO [facility] None    Hansel Feinstein, CNM

## 2019-01-26 NOTE — Progress Notes (Deleted)
  Subjective:    Rebecca [name] is being seen today for her first obstetrical visit.  This {is/is not:9024} a planned pregnancy. She is at [redacted]w[redacted]d gestation. Her obstetrical history is significant for {ob risk factors:10154}. Relationship with FOB: {fob:16621}. Patient {does/does not:19097} intend to breast feed. Pregnancy history fully reviewed.  Patient reports {sx:14538}.  Review of Systems:   Review of Systems  Objective:     BP 117/84   Pulse 87   Wt 79.8 kg   LMP 05/17/2018 (Exact Date)   BMI 28.41 kg/m  Physical Exam  Exam    Assessment:    Pregnancy: G2P1001 Patient Active Problem List   Diagnosis Date Noted  . Tachycardia 01/12/2019  . Rh negative state in antepartum period 08/14/2018  . Supervision of other normal pregnancy, antepartum 08/04/2018  . Hx of cesarean section 08/04/2018       Plan:     Initial labs drawn. Prenatal vitamins. Problem list reviewed and updated. AFP3 discussed: {requests/ordered/declines:14581}. Role of ultrasound in pregnancy discussed; fetal survey: {requests/ordered/declines:14581}. Amniocentesis discussed: {amniocentesis:14582}. Follow up in {numbers 0-4:31231} weeks. ***% of *** min visit spent on counseling and coordination of care.  ***   Hansel Feinstein 01/26/2019

## 2019-01-26 NOTE — Patient Instructions (Signed)

## 2019-01-27 LAB — CERVICOVAGINAL ANCILLARY ONLY
Chlamydia: NEGATIVE
Neisseria Gonorrhea: NEGATIVE

## 2019-01-30 LAB — CULTURE, BETA STREP (GROUP B ONLY): Strep Gp B Culture: NEGATIVE

## 2019-02-01 ENCOUNTER — Ambulatory Visit (INDEPENDENT_AMBULATORY_CARE_PROVIDER_SITE_OTHER): Payer: Medicaid Other | Admitting: Obstetrics & Gynecology

## 2019-02-01 ENCOUNTER — Encounter: Payer: Self-pay | Admitting: Obstetrics & Gynecology

## 2019-02-01 ENCOUNTER — Other Ambulatory Visit: Payer: Self-pay

## 2019-02-01 VITALS — BP 116/82 | HR 99 | Wt 177.1 lb

## 2019-02-01 DIAGNOSIS — O34219 Maternal care for unspecified type scar from previous cesarean delivery: Secondary | ICD-10-CM

## 2019-02-01 DIAGNOSIS — M549 Dorsalgia, unspecified: Secondary | ICD-10-CM

## 2019-02-01 DIAGNOSIS — Z3A37 37 weeks gestation of pregnancy: Secondary | ICD-10-CM

## 2019-02-01 DIAGNOSIS — Z98891 History of uterine scar from previous surgery: Secondary | ICD-10-CM

## 2019-02-01 DIAGNOSIS — Z6791 Unspecified blood type, Rh negative: Secondary | ICD-10-CM

## 2019-02-01 DIAGNOSIS — Z3480 Encounter for supervision of other normal pregnancy, unspecified trimester: Secondary | ICD-10-CM

## 2019-02-01 DIAGNOSIS — O26899 Other specified pregnancy related conditions, unspecified trimester: Secondary | ICD-10-CM

## 2019-02-01 DIAGNOSIS — O99891 Other specified diseases and conditions complicating pregnancy: Secondary | ICD-10-CM

## 2019-02-01 DIAGNOSIS — R000 Tachycardia, unspecified: Secondary | ICD-10-CM

## 2019-02-01 DIAGNOSIS — O9989 Other specified diseases and conditions complicating pregnancy, childbirth and the puerperium: Secondary | ICD-10-CM

## 2019-02-01 NOTE — Progress Notes (Signed)
   PRENATAL VISIT NOTE  Subjective:  Rebecca [name] is a 28 y.o. G2P1001 at [redacted]w[redacted]d being seen today for ongoing prenatal care.  She is currently monitored for the following issues for this high-risk pregnancy and has Supervision of other normal pregnancy, antepartum; Hx of cesarean section; Rh negative state in antepartum period; Tachycardia; and Back pain affecting pregnancy on their problem list.  Patient reports swelling in the feet bilaterally. .  Contractions: Irritability. Vag. Bleeding: None.  Movement: Present. Denies leaking of fluid.   The following portions of the patient's history were reviewed and updated as appropriate: allergies, current medications, past family history, past medical history, past social history, past surgical history and problem list.   Objective:   Vitals:   02/01/19 0909  BP: 116/82  Pulse: 99  Weight: 177 lb 1.9 oz (80.3 kg)    Fetal Status: Fetal Heart Rate (bpm): 153   Movement: Present     General:  Alert, oriented and cooperative. Patient is in no acute distress.  Skin: Skin is warm and dry. No rash noted.   Cardiovascular: Normal heart rate noted  Respiratory: Normal respiratory effort, no problems with respiration noted  Abdomen: Soft, gravid, appropriate for gestational age.  Pain/Pressure: Present     Pelvic: Cervical exam deferred        Extremities: Normal range of motion.  Edema: Trace  Mental Status: Normal mood and affect. Normal behavior. Normal judgment and thought content.   Assessment and Plan:  Pregnancy: G2P1001 at [redacted]w[redacted]d 1. Supervision of other normal pregnancy, antepartum FH and FHR wnl.  2. Rh negative state in antepartum period S/p Rhogam   3. Hx of cesarean section Reviewed TOLAC   4. Back pain affecting pregnancy, antepartum  5. Tachycardia Pulse WNL today  Term labor symptoms and general obstetric precautions including but not limited to vaginal bleeding, contractions, leaking of fluid and fetal movement were  reviewed in detail with the patient. Please refer to After Visit Summary for other counseling recommendations.   No follow-ups on file.  No future appointments.  Lavonia Drafts, MD

## 2019-02-01 NOTE — Patient Instructions (Signed)
Natural Childbirth Natural childbirth is when labor and delivery progresses naturally with minimal medical assistance or medicines. Natural childbirth may be an option if you have a low risk pregnancy. With the help of a birthing professional such as a midwife, doula, or other health care provider, you may be able to use relaxation techniques and controlled breathing to manage pain during labor. Many women choose natural childbirth because it makes them feel more in control and in touch with the experience of giving birth. Some women also choose natural childbirth because they are concerned that medicines may affect them and their babies. What types of natural childbirth techniques are available? There are two types of natural childbirth techniques, which are taught in classes:  The Lamaze method. In these classes, parents learn that having a baby is normal, healthy, and natural. Mothers are taught to take a neutral position regarding pain medicine and numbing medicines, and to make an informed decision about using these medicines if the time comes.  The Bradley method, also called husband-coached birth. In these classes, the father or partner learns to be the birth coach. The mother is encouraged to exercise and eat a balanced, nutritious diet. Both parents also learn relaxation and deep breathing exercises and are taught how to prepare for emergency situations. What are some natural pain and relaxation techniques? If you are considering a natural childbirth, you should explore your options for managing pain and discomfort during your labor and delivery. Some natural pain and relaxation techniques include:  Meditation.  Yoga.  Hypnosis.  Acupuncture.  Massage.  Changing positions, such as by walking, rocking, showering, or leaning on birth balls.  Lying in warm water or a whirlpool bath.  Finding an activity that keeps your mind off the labor pain.  Listening to soft music.  Focusing  on a particular object (visual imagery). How can I prepare for a natural birth?  Talk with your spouse or partner about your goals for having a natural childbirth.  Decide if you will have your baby in the hospital, at a birthing center, or at home.  Have your spouse or partner attend the natural childbirth technique classes with you.  Decide which type of health care provider you would like to assist you with your delivery.  If you have other children, make plans to have someone take care of them when you go to the hospital or birthing center.  Know the distance and the time it takes to go to the hospital or birthing center. Practice going there and time it to be sure.  Have a bag packed with a nightgown, bathrobe, and toiletries. Be ready to take it with you when you go into labor.  Keep phone numbers of your family and friends handy if you need to call someone when you go into labor.  Talk with your health care provider about the possibility of a medical emergency and what will happen if that occurs. What are the advantages and disadvantages of natural childbirth? Advantages  You are in control of your labor and delivery experience.  You may be able to avoid some common medical practices, such as getting medicines or being monitored all the time.  You and your spouse or partner will work together, which can increase your bond with each other.  In most delivery centers, your family and friends can be involved in the labor and delivery process. Disadvantages  The methods of helping relieve labor pains may not work for you.  You may feel   disappointed if you change your mind during labor and decide not to have a natural childbirth. What can I expect after delivery?  You may feel very tired.  You may feel uncomfortable as your uterus contracts.  The area around your vagina will feel sore.  You may feel cold and shaky. What questions should I ask my health care provider?   Am I a good candidate for natural childbirth?  Can you refer me to classes to learn more about natural childbirth?  How do I create a birth plan that outlines my wishes for natural childbirth? Where to find more information  American Pregnancy Association: americanpregnancy.org  American Congress of Obstetricians and Gynecologists: acog.org  American College of Nurse-Midwives: www.midwife.org Summary  Natural childbirth may be an option if you have a low risk pregnancy.  The Bradley or Lamaze Methods are techniques that can assist you in achieving a natural birth experience.  Talk to your health care provider to determine if you are a good candidate for a natural childbirth. This information is not intended to replace advice given to you by your health care provider. Make sure you discuss any questions you have with your health care provider. Document Released: 04/18/2008 Document Revised: 08/28/2018 Document Reviewed: 07/15/2016 Elsevier Patient Education  2020 Elsevier Inc.  

## 2019-02-03 ENCOUNTER — Ambulatory Visit: Payer: Medicaid Other | Admitting: Cardiology

## 2019-02-09 ENCOUNTER — Ambulatory Visit (INDEPENDENT_AMBULATORY_CARE_PROVIDER_SITE_OTHER): Payer: Medicaid Other | Admitting: Advanced Practice Midwife

## 2019-02-09 ENCOUNTER — Encounter: Payer: Self-pay | Admitting: Advanced Practice Midwife

## 2019-02-09 ENCOUNTER — Other Ambulatory Visit: Payer: Self-pay

## 2019-02-09 VITALS — BP 122/81 | HR 86 | Wt 178.1 lb

## 2019-02-09 DIAGNOSIS — Z3480 Encounter for supervision of other normal pregnancy, unspecified trimester: Secondary | ICD-10-CM

## 2019-02-09 DIAGNOSIS — Z98891 History of uterine scar from previous surgery: Secondary | ICD-10-CM

## 2019-02-09 DIAGNOSIS — Z3483 Encounter for supervision of other normal pregnancy, third trimester: Secondary | ICD-10-CM

## 2019-02-09 DIAGNOSIS — Z3A38 38 weeks gestation of pregnancy: Secondary | ICD-10-CM

## 2019-02-09 NOTE — Patient Instructions (Signed)

## 2019-02-09 NOTE — Progress Notes (Deleted)
  Subjective:    Rebecca [name] is being seen today for her first obstetrical visit.  This {is/is not:9024} a planned pregnancy. She is at [redacted]w[redacted]d gestation. Her obstetrical history is significant for {ob risk factors:10154}. Relationship with FOB: {fob:16621}. Patient {does/does not:19097} intend to breast feed. Pregnancy history fully reviewed.  Patient reports {sx:14538}.  Review of Systems:   Review of Systems  Objective:     BP 122/81   Pulse 86   Wt 80.8 kg   LMP 05/17/2018 (Exact Date)   BMI 28.75 kg/m  Physical Exam  Exam    Assessment:    Pregnancy: G2P1001 Patient Active Problem List   Diagnosis Date Noted  . Back pain affecting pregnancy 01/26/2019  . Tachycardia 01/12/2019  . Rh negative state in antepartum period 08/14/2018  . Supervision of other normal pregnancy, antepartum 08/04/2018  . Hx of cesarean section 08/04/2018       Plan:     Initial labs drawn. Prenatal vitamins. Problem list reviewed and updated. AFP3 discussed: {requests/ordered/declines:14581}. Role of ultrasound in pregnancy discussed; fetal survey: {requests/ordered/declines:14581}. Amniocentesis discussed: {amniocentesis:14582}. Follow up in {numbers 0-4:31231} weeks. ***% of *** min visit spent on counseling and coordination of care.  ***   Hansel Feinstein 02/09/2019

## 2019-02-09 NOTE — Progress Notes (Signed)
   PRENATAL VISIT NOTE  Subjective:  Rebecca [name] is a 28 y.o. G2P1001 at [redacted]w[redacted]d being seen today for ongoing prenatal care.  She is currently monitored for the following issues for this high-risk pregnancy and has Supervision of other normal pregnancy, antepartum; Hx of cesarean section; Rh negative state in antepartum period; Tachycardia; and Back pain affecting pregnancy on their problem list.  Patient reports no complaints.  Contractions: Irregular. Vag. Bleeding: None.  Movement: Present. Denies leaking of fluid.   The following portions of the patient's history were reviewed and updated as appropriate: allergies, current medications, past family history, past medical history, past social history, past surgical history and problem list.   Objective:   Vitals:   02/09/19 0939  BP: 122/81  Pulse: 86  Weight: 80.8 kg    Fetal Status: Fetal Heart Rate (bpm): 145 Fundal Height: 38 cm Movement: Present  Presentation: Vertex  General:  Alert, oriented and cooperative. Patient is in no acute distress.  Skin: Skin is warm and dry. No rash noted.   Cardiovascular: Normal heart rate noted  Respiratory: Normal respiratory effort, no problems with respiration noted  Abdomen: Soft, gravid, appropriate for gestational age.  Pain/Pressure: Present     Pelvic: Cervical exam performed Dilation: 1 Effacement (%): 60 Station: Ballotable, -3  Extremities: Normal range of motion.  Edema: Trace  Mental Status: Normal mood and affect. Normal behavior. Normal judgment and thought content.   Assessment and Plan:  Pregnancy: G2P1001 at [redacted]w[redacted]d 1. Supervision of other normal pregnancy, antepartum  Still plans TOLAC Discussed how to get to hospital, labor eval routine Midwife preferred   Term labor symptoms and general obstetric precautions including but not limited to vaginal bleeding, contractions, leaking of fluid and fetal movement were reviewed in detail with the patient. Please refer to After  Visit Summary for other counseling recommendations.   Return in about 1 week (around 02/16/2019) for [facility].  Future Appointments  Date Time Provider [place]  02/16/2019  9:45 AM Seabron Spates, CNM [facility] None    Hansel Feinstein, CNM

## 2019-02-16 ENCOUNTER — Encounter: Payer: Self-pay | Admitting: Advanced Practice Midwife

## 2019-02-16 ENCOUNTER — Ambulatory Visit (INDEPENDENT_AMBULATORY_CARE_PROVIDER_SITE_OTHER): Payer: Medicaid Other | Admitting: Advanced Practice Midwife

## 2019-02-16 ENCOUNTER — Other Ambulatory Visit: Payer: Self-pay

## 2019-02-16 VITALS — BP 121/84 | HR 89 | Wt 181.1 lb

## 2019-02-16 DIAGNOSIS — Z3A39 39 weeks gestation of pregnancy: Secondary | ICD-10-CM

## 2019-02-16 DIAGNOSIS — Z3480 Encounter for supervision of other normal pregnancy, unspecified trimester: Secondary | ICD-10-CM

## 2019-02-16 DIAGNOSIS — Z98891 History of uterine scar from previous surgery: Secondary | ICD-10-CM

## 2019-02-16 DIAGNOSIS — Z3483 Encounter for supervision of other normal pregnancy, third trimester: Secondary | ICD-10-CM

## 2019-02-16 NOTE — Progress Notes (Signed)
   PRENATAL VISIT NOTE  Subjective:  Rebecca [name] is a 28 y.o. G2P1001 at [redacted]w[redacted]d being seen today for ongoing prenatal care.  She is currently monitored for the following issues for this high-risk pregnancy and has Supervision of other normal pregnancy, antepartum; Hx of cesarean section; Rh negative state in antepartum period; Tachycardia; and Back pain affecting pregnancy on their problem list.  Patient reports occasional contractions.  Contractions: Irregular. Vag. Bleeding: Scant.  Movement: Present. Denies leaking of fluid.   The following portions of the patient's history were reviewed and updated as appropriate: allergies, current medications, past family history, past medical history, past social history, past surgical history and problem list.   Objective:   Vitals:   02/16/19 0945 02/16/19 1010  BP: 118/89 121/84  Pulse: 100 89  Weight: 82.1 kg     Fetal Status: Fetal Heart Rate (bpm): 156 Fundal Height: 38 cm Movement: Present  Presentation: Vertex  General:  Alert, oriented and cooperative. Patient is in no acute distress.  Skin: Skin is warm and dry. No rash noted.   Cardiovascular: Normal heart rate noted  Respiratory: Normal respiratory effort, no problems with respiration noted  Abdomen: Soft, gravid, appropriate for gestational age.  Pain/Pressure: Present     Pelvic: Cervical exam performed Dilation: 1.5 Effacement (%): 80 Station: -2  Extremities: Normal range of motion.  Edema: Trace  Mental Status: Normal mood and affect. Normal behavior. Normal judgment and thought content.   Assessment and Plan:  Pregnancy: G2P1001 at [redacted]w[redacted]d . Previous C/S Desired TOLAC Reviewed signs of labor Reviewed how to get to MAU  Term labor symptoms and general obstetric precautions including but not limited to vaginal bleeding, contractions, leaking of fluid and fetal movement were reviewed in detail with the patient. Please refer to After Visit Summary for other counseling  recommendations.   Return in about 1 week (around 02/23/2019) for [employer].  Future Appointments  Date Time Provider [place]  02/26/2019 10:15 AM Truett Mainland, DO [facility] None  03/29/2019  9:45 AM Lavonia Drafts, MD [facility] None    Hansel Feinstein, CNM

## 2019-02-16 NOTE — Patient Instructions (Signed)
Vaginal Birth After Cesarean Delivery  Vaginal birth after cesarean delivery (VBAC) is giving birth vaginally after previously delivering a baby through a cesarean section (C-section). A VBAC may be a safe option for you, depending on your health and other factors. It is important to discuss VBAC with your health care provider early in your pregnancy so you can understand the risks, benefits, and options. Having these discussions early will give you time to make your birth plan. Who are the best candidates for VBAC? The best candidates for VBAC are women who:  Have had one or two prior cesarean deliveries, and the incision made during the delivery was horizontal (low transverse).  Do not have a vertical (classical) scar on their uterus.  Have not had a tear in the wall of their uterus (uterine rupture).  Plan to have more pregnancies. A VBAC is also more likely to be successful:  In women who have previously given birth vaginally.  When labor starts by itself (spontaneously) before the due date. What are the benefits of VBAC? The benefits of delivering your baby vaginally instead of by a cesarean delivery include:  A shorter hospital stay.  A faster recovery time.  Less pain.  Avoiding risks associated with major surgery, such as infection and blood clots.  Less blood loss and less need for donated blood (transfusions). What are the risks of VBAC? The main risk of attempting a VBAC is that it may fail, forcing your health care provider to deliver your baby by a C-section. Other risks are rare and include:  Tearing (rupture) of the scar from a past cesarean delivery.  Other risks associated with vaginal deliveries. If a repeat cesarean delivery is needed, the risks include:  Blood loss.  Infection.  Blood clot.  Damage to surrounding organs.  Removal of the uterus (hysterectomy), if it is damaged.  Placenta problems in future pregnancies. What else should I know  about my options? Delivering a baby through a VBAC is similar to having a normal spontaneous vaginal delivery. Therefore, it is safe:  To try with twins.  For your health care provider to try to turn the baby from a breech position (external cephalic version) during labor.  With epidural analgesia for pain relief. Consider where you would like to deliver your baby. VBAC should be attempted in facilities where an emergency cesarean delivery can be performed. VBAC is not recommended for home births. Any changes in your health or your baby's health during your pregnancy may make it necessary to change your initial decision about VBAC. Your health care provider may recommend that you do not attempt a VBAC if:  Your baby's suspected weight is 8.8 lb (4 kg) or more.  You have preeclampsia. This is a condition that causes high blood pressure along with other symptoms, such as swelling and headaches.  You will have VBAC less than 19 months after your cesarean delivery.  You are past your due date.  You need to have labor started (induced) because your cervix is not ready for labor (unfavorable). Where to find more information  American Pregnancy Association: americanpregnancy.org  American Congress of Obstetricians and Gynecologists: acog.org Summary  Vaginal birth after cesarean delivery (VBAC) is giving birth vaginally after previously delivering a baby through a cesarean section (C-section). A VBAC may be a safe option for you, depending on your health and other factors.  Discuss VBAC with your health care provider early in your pregnancy so you can understand the risks, benefits, options, and   have plenty of time to make your birth plan.  The main risk of attempting a VBAC is that it may fail, forcing your health care provider to deliver your baby by a C-section. Other risks are rare. This information is not intended to replace advice given to you by your health care provider. Make sure  you discuss any questions you have with your health care provider. Document Released: 10/27/2006 Document Revised: 09/01/2018 Document Reviewed: 08/13/2016 Elsevier Patient Education  2020 Elsevier Inc.  

## 2019-02-17 ENCOUNTER — Other Ambulatory Visit: Payer: Self-pay

## 2019-02-17 ENCOUNTER — Inpatient Hospital Stay (HOSPITAL_COMMUNITY)
Admission: AD | Admit: 2019-02-17 | Discharge: 2019-02-20 | DRG: 806 | Disposition: A | Discharge status: Home / Self Care | Payer: Medicaid Other | Attending: Obstetrics & Gynecology | Admitting: Obstetrics & Gynecology

## 2019-02-17 ENCOUNTER — Encounter (HOSPITAL_COMMUNITY): Payer: Self-pay

## 2019-02-17 DIAGNOSIS — Z3A39 39 weeks gestation of pregnancy: Secondary | ICD-10-CM

## 2019-02-17 DIAGNOSIS — O34219 Maternal care for unspecified type scar from previous cesarean delivery: Principal | ICD-10-CM | POA: Diagnosis present

## 2019-02-17 DIAGNOSIS — Z6791 Unspecified blood type, Rh negative: Secondary | ICD-10-CM

## 2019-02-17 DIAGNOSIS — O26893 Other specified pregnancy related conditions, third trimester: Secondary | ICD-10-CM | POA: Diagnosis present

## 2019-02-17 DIAGNOSIS — Z98891 History of uterine scar from previous surgery: Secondary | ICD-10-CM

## 2019-02-17 DIAGNOSIS — Z20828 Contact with and (suspected) exposure to other viral communicable diseases: Secondary | ICD-10-CM | POA: Diagnosis present

## 2019-02-17 HISTORY — DX: Other specified health status: Z78.9

## 2019-02-17 NOTE — MAU Note (Signed)
Pt states that she started having ctx's yesterday they were 15 minutes apart.   Since 1940 pt states that ctx's are now more regular.   Denies vaginal bleeding, or LOF.   Reports+FM

## 2019-02-18 ENCOUNTER — Encounter (HOSPITAL_COMMUNITY): Payer: Self-pay

## 2019-02-18 ENCOUNTER — Inpatient Hospital Stay (HOSPITAL_COMMUNITY): Payer: Medicaid Other | Admitting: Anesthesiology

## 2019-02-18 DIAGNOSIS — Z20828 Contact with and (suspected) exposure to other viral communicable diseases: Secondary | ICD-10-CM | POA: Diagnosis present

## 2019-02-18 DIAGNOSIS — Z3689 Encounter for other specified antenatal screening: Secondary | ICD-10-CM | POA: Diagnosis not present

## 2019-02-18 DIAGNOSIS — Z3A39 39 weeks gestation of pregnancy: Secondary | ICD-10-CM | POA: Diagnosis not present

## 2019-02-18 DIAGNOSIS — O34219 Maternal care for unspecified type scar from previous cesarean delivery: Secondary | ICD-10-CM | POA: Diagnosis not present

## 2019-02-18 DIAGNOSIS — Z6791 Unspecified blood type, Rh negative: Secondary | ICD-10-CM | POA: Diagnosis not present

## 2019-02-18 DIAGNOSIS — O360130 Maternal care for anti-D [Rh] antibodies, third trimester, not applicable or unspecified: Secondary | ICD-10-CM | POA: Diagnosis not present

## 2019-02-18 DIAGNOSIS — O26893 Other specified pregnancy related conditions, third trimester: Secondary | ICD-10-CM | POA: Diagnosis present

## 2019-02-18 LAB — CBC
HCT: 36.5 % (ref 36.0–46.0)
Hemoglobin: 12.6 g/dL (ref 12.0–15.0)
MCH: 30 pg (ref 26.0–34.0)
MCHC: 34.5 g/dL (ref 30.0–36.0)
MCV: 86.9 fL (ref 80.0–100.0)
Platelets: 212 10*3/uL (ref 150–400)
RBC: 4.2 MIL/uL (ref 3.87–5.11)
RDW: 13.5 % (ref 11.5–15.5)
WBC: 11.8 10*3/uL — ABNORMAL HIGH (ref 4.0–10.5)
nRBC: 0 % (ref 0.0–0.2)

## 2019-02-18 LAB — RPR: RPR Ser Ql: NONREACTIVE

## 2019-02-18 LAB — SARS CORONAVIRUS 2 BY RT PCR (HOSPITAL ORDER, PERFORMED IN ~~LOC~~ HOSPITAL LAB): SARS Coronavirus 2: NEGATIVE

## 2019-02-18 MED ORDER — WITCH HAZEL-GLYCERIN EX PADS: 1.0000 "application " | MEDICATED_PAD | CUTANEOUS | Status: DC | PRN

## 2019-02-18 MED ORDER — FENTANYL-BUPIVACAINE-NACL 0.5-0.125-0.9 MG/250ML-% EP SOLN
12.0000 mL/h | EPIDURAL | Status: DC | PRN
  Filled 2019-02-18: qty 250

## 2019-02-18 MED ORDER — FLEET ENEMA 7-19 GM/118ML RE ENEM: 1.0000 | ENEMA | RECTAL | Status: DC | PRN

## 2019-02-18 MED ORDER — OXYTOCIN 40 UNITS IN NORMAL SALINE INFUSION - SIMPLE MED: 2.5000 [IU]/h | INTRAVENOUS | Status: DC

## 2019-02-18 MED ORDER — PHENYLEPHRINE 40 MCG/ML (10ML) SYRINGE FOR IV PUSH (FOR BLOOD PRESSURE SUPPORT)
PREFILLED_SYRINGE | INTRAVENOUS | Status: AC
  Filled 2019-02-18: qty 10

## 2019-02-18 MED ORDER — SOD CITRATE-CITRIC ACID 500-334 MG/5ML PO SOLN: 30.0000 mL | ORAL | Status: DC | PRN

## 2019-02-18 MED ORDER — LACTATED RINGERS IV SOLN
INTRAVENOUS | Status: DC
  Administered 2019-02-18: 05:00:00 via INTRAVENOUS

## 2019-02-18 MED ORDER — OXYTOCIN BOLUS FROM INFUSION
500.0000 mL | Freq: Once | INTRAVENOUS | Status: AC
  Administered 2019-02-18: 500 mL via INTRAVENOUS

## 2019-02-18 MED ORDER — DIPHENHYDRAMINE HCL 50 MG/ML IJ SOLN: 12.5000 mg | INTRAMUSCULAR | Status: DC | PRN

## 2019-02-18 MED ORDER — SENNOSIDES-DOCUSATE SODIUM 8.6-50 MG PO TABS
2.0000 | ORAL_TABLET | ORAL | Status: DC
  Administered 2019-02-18 – 2019-02-19 (×2): 2 via ORAL
  Filled 2019-02-18 (×2): qty 2

## 2019-02-18 MED ORDER — LACTATED RINGERS IV SOLN: 500.0000 mL | Freq: Once | INTRAVENOUS | Status: DC

## 2019-02-18 MED ORDER — SIMETHICONE 80 MG PO CHEW: 80.0000 mg | CHEWABLE_TABLET | ORAL | Status: DC | PRN

## 2019-02-18 MED ORDER — OXYCODONE-ACETAMINOPHEN 5-325 MG PO TABS: 2.0000 | ORAL_TABLET | ORAL | Status: DC | PRN

## 2019-02-18 MED ORDER — EPHEDRINE 5 MG/ML INJ: 10.0000 mg | INTRAVENOUS | Status: DC | PRN

## 2019-02-18 MED ORDER — ZOLPIDEM TARTRATE 5 MG PO TABS: 5.0000 mg | ORAL_TABLET | Freq: Every evening | ORAL | Status: DC | PRN

## 2019-02-18 MED ORDER — ONDANSETRON HCL 4 MG/2ML IJ SOLN: 4.0000 mg | Freq: Four times a day (QID) | INTRAMUSCULAR | Status: DC | PRN

## 2019-02-18 MED ORDER — TERBUTALINE SULFATE 1 MG/ML IJ SOLN: 0.2500 mg | Freq: Once | INTRAMUSCULAR | Status: DC | PRN

## 2019-02-18 MED ORDER — DIBUCAINE (PERIANAL) 1 % EX OINT: 1.0000 "application " | TOPICAL_OINTMENT | CUTANEOUS | Status: DC | PRN

## 2019-02-18 MED ORDER — DIPHENHYDRAMINE HCL 25 MG PO CAPS: 25.0000 mg | ORAL_CAPSULE | Freq: Four times a day (QID) | ORAL | Status: DC | PRN

## 2019-02-18 MED ORDER — ONDANSETRON HCL 4 MG/2ML IJ SOLN: 4.0000 mg | INTRAMUSCULAR | Status: DC | PRN

## 2019-02-18 MED ORDER — MEASLES, MUMPS & RUBELLA VAC IJ SOLR: 0.5000 mL | Freq: Once | INTRAMUSCULAR | Status: DC

## 2019-02-18 MED ORDER — LACTATED RINGERS IV SOLN: 500.0000 mL | INTRAVENOUS | Status: DC | PRN

## 2019-02-18 MED ORDER — ACETAMINOPHEN 325 MG PO TABS: 650.0000 mg | ORAL_TABLET | ORAL | Status: DC | PRN

## 2019-02-18 MED ORDER — IBUPROFEN 600 MG PO TABS
600.0000 mg | ORAL_TABLET | Freq: Four times a day (QID) | ORAL | Status: DC
  Administered 2019-02-18 – 2019-02-20 (×9): 600 mg via ORAL
  Filled 2019-02-18 (×9): qty 1

## 2019-02-18 MED ORDER — FENTANYL-BUPIVACAINE-NACL 0.5-0.125-0.9 MG/250ML-% EP SOLN: 12.0000 mL/h | EPIDURAL | Status: DC | PRN

## 2019-02-18 MED ORDER — ACETAMINOPHEN 325 MG PO TABS
650.0000 mg | ORAL_TABLET | ORAL | Status: DC | PRN
  Administered 2019-02-18 – 2019-02-19 (×3): 650 mg via ORAL
  Filled 2019-02-18 (×3): qty 2

## 2019-02-18 MED ORDER — PRENATAL MULTIVITAMIN CH
1.0000 | ORAL_TABLET | Freq: Every day | ORAL | Status: DC
  Administered 2019-02-19 – 2019-02-20 (×2): 1 via ORAL
  Filled 2019-02-18 (×2): qty 1

## 2019-02-18 MED ORDER — OXYCODONE HCL 5 MG PO TABS
5.0000 mg | ORAL_TABLET | ORAL | Status: DC | PRN
  Administered 2019-02-18 – 2019-02-20 (×2): 5 mg via ORAL
  Filled 2019-02-18 (×2): qty 1

## 2019-02-18 MED ORDER — AMMONIA AROMATIC IN INHA
RESPIRATORY_TRACT | Status: AC
  Filled 2019-02-18: qty 10

## 2019-02-18 MED ORDER — LIDOCAINE HCL (PF) 1 % IJ SOLN: 30.0000 mL | INTRAMUSCULAR | Status: DC | PRN

## 2019-02-18 MED ORDER — TETANUS-DIPHTH-ACELL PERTUSSIS 5-2.5-18.5 LF-MCG/0.5 IM SUSP: 0.5000 mL | Freq: Once | INTRAMUSCULAR | Status: DC

## 2019-02-18 MED ORDER — ONDANSETRON HCL 4 MG PO TABS: 4.0000 mg | ORAL_TABLET | ORAL | Status: DC | PRN

## 2019-02-18 MED ORDER — BENZOCAINE-MENTHOL 20-0.5 % EX AERO
1.0000 "application " | INHALATION_SPRAY | CUTANEOUS | Status: DC | PRN
  Administered 2019-02-18: 1 via TOPICAL
  Filled 2019-02-18: qty 56

## 2019-02-18 MED ORDER — PHENYLEPHRINE 40 MCG/ML (10ML) SYRINGE FOR IV PUSH (FOR BLOOD PRESSURE SUPPORT): 80.0000 ug | PREFILLED_SYRINGE | INTRAVENOUS | Status: DC | PRN

## 2019-02-18 MED ORDER — OXYTOCIN 40 UNITS IN NORMAL SALINE INFUSION - SIMPLE MED
1.0000 m[IU]/min | INTRAVENOUS | Status: DC
  Administered 2019-02-18: 2 m[IU]/min via INTRAVENOUS
  Filled 2019-02-18: qty 1000

## 2019-02-18 MED ORDER — PHENYLEPHRINE 40 MCG/ML (10ML) SYRINGE FOR IV PUSH (FOR BLOOD PRESSURE SUPPORT)
80.0000 ug | PREFILLED_SYRINGE | INTRAVENOUS | Status: DC | PRN
  Filled 2019-02-18: qty 10

## 2019-02-18 MED ORDER — OXYCODONE-ACETAMINOPHEN 5-325 MG PO TABS: 1.0000 | ORAL_TABLET | ORAL | Status: DC | PRN

## 2019-02-18 MED ORDER — LIDOCAINE HCL (PF) 1 % IJ SOLN
INTRAMUSCULAR | Status: DC | PRN
  Administered 2019-02-18 (×2): 4 mL via EPIDURAL

## 2019-02-18 MED ORDER — SODIUM CHLORIDE (PF) 0.9 % IJ SOLN
INTRAMUSCULAR | Status: DC | PRN
  Administered 2019-02-18: 12 mL/h via EPIDURAL

## 2019-02-18 MED ORDER — COCONUT OIL OIL: 1.0000 "application " | TOPICAL_OIL | Status: DC | PRN

## 2019-02-18 NOTE — Progress Notes (Signed)
Patient ID: Rebecca [name], female   DOB: 05/03/1991, 28 y.o.   MRN: [id number]  Called for delivery; pt with BBOW protruding from vagina with head at +1/+2 station; hasn't started pushing yet but is having a lot of pressure and back pain  BP 125/74, P 91 FHR 140s, +accels, no decels, occ variables Ctx q 3 mins with Pit at 70mu/min  IUP@39 .4wks End 1st stage/beginning pushing TOLAC  Will begin pushing w/ ctx Anticipate successful VBAC Wants to try for en caul birth if possible  Myrtis Ser [facility] 02/18/2019 8:50 AM

## 2019-02-18 NOTE — Progress Notes (Signed)
Patient ID: Rebecca [name], female   DOB: 1990/12/24, 28 y.o.   MRN: [id number]  Comfortable with epidural; started Pit due to ctx spacing out a bit  BP 103/60, P 78 FHR 150s, +accels, no decels Ctx q 2 mins with Pit at 40mu/min Cx 8/C/vtx -2 just before 0500  IUP@39 .4wks TOLAC Active labor/transition  Plan to check cx in an hour or so, or sooner with symptoms  Myrtis Ser CNM 02/18/2019 5:58 AM

## 2019-02-18 NOTE — Anesthesia Postprocedure Evaluation (Signed)
Anesthesia Post Note  Patient: Rebecca [name]  Procedure(s) Performed: AN AD Nord     Patient location during evaluation: Mother Baby Anesthesia Type: Epidural Level of consciousness: awake and alert and oriented Pain management: satisfactory to patient Vital Signs Assessment: post-procedure vital signs reviewed and stable Respiratory status: spontaneous breathing and nonlabored ventilation Cardiovascular status: stable Postop Assessment: no headache, no backache, no signs of nausea or vomiting, adequate PO intake, patient able to bend at knees and able to ambulate (patient up walking) Anesthetic complications: no    Last Vitals:  Vitals:   02/18/19 1220 02/18/19 1330  BP: 102/77 104/70  Pulse: (!) 101 100  Resp: 18 16  Temp: 36.7 C 36.6 C  SpO2: 100% 100%    Last Pain:  Vitals:   02/18/19 1330  TempSrc: Oral  PainSc: 8    Pain Goal:                Epidural/Spinal Function Cutaneous sensation: Normal sensation (02/18/19 1330), Patient able to flex knees: Yes (02/18/19 1330), Patient able to lift hips off bed: Yes (02/18/19 1330), Back pain beyond tenderness at insertion site: No (02/18/19 1330), Progressively worsening motor and/or sensory loss: No (02/18/19 1330), Bowel and/or bladder incontinence post epidural: No (02/18/19 1330)  Terrace Fontanilla

## 2019-02-18 NOTE — Lactation Note (Addendum)
This note was copied from a baby's chart. Lactation Consultation Note  Patient Name: Rebecca [name] [id number] Date: 02/18/2019   P2, Baby 5 hours old and has breastfed x2 since birth. Mother breastfed her daughter for 24 mos. States this baby has latched well on both breasts so far.  Mother states she has been hand expressing and giving baby drops on her finger. Baby is sleeping.  Suggest mother place to him STS to interest him in feeding. Feed on demand with cues.  Goal 8-12+ times per day after first 24 hrs.  Place baby STS if not cueing.  Discussed basics.  Mom made aware of O/P services, breastfeeding support groups, community resources, and our phone # for post-discharge questions.       Maternal Data    Feeding Feeding Type: Breast Fed  LATCH Score Latch: Grasps breast easily, tongue down, lips flanged, rhythmical sucking.  Audible Swallowing: Spontaneous and intermittent  Type of Nipple: Everted at rest and after stimulation  Comfort (Breast/Nipple): Soft / non-tender  Hold (Positioning): No assistance needed to correctly position infant at breast.  LATCH Score: 10  Interventions    Lactation Tools Discussed/Used     Consult Status      Vivianne Master [facility] 02/18/2019, 2:24 PM

## 2019-02-18 NOTE — Anesthesia Preprocedure Evaluation (Signed)
Anesthesia Evaluation  Patient identified by MRN, date of birth, ID band Patient awake    Reviewed: Allergy & Precautions, NPO status , Patient's Chart, lab work & pertinent test results  Airway Mallampati: II  TM Distance: >3 FB Neck ROM: Full    Dental no notable dental hx. (+) Dental Advisory Given   Pulmonary neg pulmonary ROS,    Pulmonary exam normal breath sounds clear to auscultation       Cardiovascular negative cardio ROS Normal cardiovascular exam Rhythm:Regular Rate:Normal     Neuro/Psych negative neurological ROS  negative psych ROS   GI/Hepatic negative GI ROS, Neg liver ROS,   Endo/Other  negative endocrine ROS  Renal/GU negative Renal ROS     Musculoskeletal negative musculoskeletal ROS (+)   Abdominal   Peds  Hematology negative hematology ROS (+)   Anesthesia Other Findings   Reproductive/Obstetrics (+) Pregnancy                             Anesthesia Physical Anesthesia Plan  ASA: II  Anesthesia Plan: Epidural   Post-op Pain Management:    Induction:   PONV Risk Score and Plan:   Airway Management Planned:   Additional Equipment:   Intra-op Plan:   Post-operative Plan:   Informed Consent: I have reviewed the patients History and Physical, chart, labs and discussed the procedure including the risks, benefits and alternatives for the proposed anesthesia with the patient or authorized representative who has indicated his/her understanding and acceptance.       Plan Discussed with:   Anesthesia Plan Comments:         Anesthesia Quick Evaluation  

## 2019-02-18 NOTE — MAU Provider Note (Signed)
Pt informed that the ultrasound is considered a limited OB ultrasound and is not intended to be a complete ultrasound exam.  Patient also informed that the ultrasound is not being completed with the intent of assessing for fetal or placental anomalies or any pelvic abnormalities.  Explained that the purpose of today's ultrasound is to assess for presentation  Patient acknowledges the purpose of the exam and the limitations of the study.    Fetus is noted to be in vertex presentation  Seabron Spates, CNM

## 2019-02-18 NOTE — Discharge Summary (Addendum)
Postpartum Discharge Summary    Patient Name: Rebecca [name] DOB: [birth year] MRN: [id number]  Date of admission: 02/17/2019 Delivering Provider: Serita Grammes D   Date of discharge: 02/20/2019  Admitting diagnosis: 39 WKS, CTX Intrauterine pregnancy: [redacted]w[redacted]d    Secondary diagnosis:  Active Problems:   Hx of cesarean section   Rh negative state in antepartum period   Indication for care in labor or delivery  Additional problems: none     Discharge diagnosis: Term Pregnancy Delivered and VBAC                                                                                                Post partum procedures:  Augmentation: Pitocin  Complications: None  Hospital course:  Onset of Labor With Vaginal Delivery     28y.o. yo [id number] admitted in Active Labor on 02/17/2019. Patient had an uncomplicated labor requiring a little Pitocin to get to completely dilated.  Membrane Rupture Time/Date: 9:02 AM ,02/18/2019   Intrapartum Procedures: Episiotomy: None [1]                                         Lacerations:  2nd degree [3];Vaginal [6];Labial [10]  Patient had a delivery of a Viable infant. 02/18/2019  Information for the patient's newborn:  ShFrancessca, Friis0[[id number]]     Pateint had an uncomplicated postpartum course.  She is ambulating, tolerating a regular diet, passing flatus, and urinating well. Patient is discharged home in stable condition on 02/18/19.  Delivery time: 9:03 AM    Magnesium Sulfate received: No BMZ received: No Rhophylac:Yes MMR:N/A Transfusion:No  Physical exam  Vitals:   02/18/19 0602 02/18/19 0632 02/18/19 0731 02/18/19 0801  BP: 101/61 111/81 (!) 123/92 125/74  Pulse: 85 94 98 91  Resp:   20 20  Temp:   98.3 F (36.8 C)   TempSrc:   Oral   SpO2:      Weight:      Height:       General: alert, cooperative and no distress Lochia: appropriate Uterine Fundus: firm Incision: N/A DVT Evaluation: No evidence of DVT  seen on physical exam. Labs: Lab Results  Component Value Date   WBC 11.8 (H) 02/18/2019   HGB 12.6 02/18/2019   HCT 36.5 02/18/2019   MCV 86.9 02/18/2019   PLT 212 02/18/2019   No flowsheet data found.  Discharge instruction: per After Visit Summary and "Baby and Me Booklet".  After visit meds:  Allergies as of 02/20/2019   No Known Allergies     Medication List    STOP taking these medications   Comfort Fit Maternity Supp Med Misc     TAKE these medications   ibuprofen 600 MG tablet Commonly known as: ADVIL Take 1 tablet (600 mg total) by mouth every 6 (six) hours.   PRENATAL VITAMINS PO Take 1 tablet by mouth daily.      Diet: routine diet  Activity: Advance as tolerated. Pelvic  rest for 6 weeks.   Outpatient follow up:4 weeks Follow up Appt: Future Appointments  Date Time Provider [place]  02/26/2019 10:15 AM Truett Mainland, DO [facility] None  03/29/2019  9:45 AM Lavonia Drafts, MD [facility] None   Follow up Visit:  Please schedule this patient for Postpartum visit in: 4 weeks- can be virtual with the following provider: Any provider For C/S patients schedule nurse incision check in weeks 2 weeks: no High risk pregnancy complicated by: TOLAC Delivery mode:  SVD Anticipated Birth Control:  other/unsure PP Procedures needed: none  Schedule Integrated BH visit: no  Newborn Data: Live born female  Birth Weight: 3715gm (8lb 3oz)  APGAR: 81, 9  Newborn Delivery   Birth date/time: 02/18/2019 09:03:00 Delivery type: VBAC, Spontaneous      Baby Feeding: Breast Disposition:home with mother   02/20/2019 Wende Mott, CNM

## 2019-02-18 NOTE — Anesthesia Procedure Notes (Signed)
Epidural Patient location during procedure: OB  Staffing Anesthesiologist: Kauan Kloosterman, MD Performed: anesthesiologist   Preanesthetic Checklist Completed: patient identified, pre-op evaluation, timeout performed, IV checked, risks and benefits discussed and monitors and equipment checked  Epidural Patient position: sitting Prep: site prepped and draped and DuraPrep Patient monitoring: heart rate, continuous pulse ox and blood pressure Approach: midline Location: L3-L4 Injection technique: LOR air and LOR saline  Needle:  Needle type: Tuohy  Needle gauge: 17 G Needle length: 9 cm Needle insertion depth: 5 cm Catheter type: closed end flexible Catheter size: 19 Gauge Catheter at skin depth: 10 cm Test dose: negative  Assessment Sensory level: T8 Events: blood not aspirated, injection not painful, no injection resistance, negative IV test and no paresthesia  Additional Notes Reason for block:procedure for pain     

## 2019-02-18 NOTE — H&P (Signed)
Rebecca [name] is a 28 y.o. female G2P1001 @ 39.4wks by LMP and confirmed by 11wk scan presenting for reg ctx. Denies leaking or bldg; no N/V, visual disturbances or RUQ pain. Her preg has been followed by the [facility] office since 11wks and has been remarkable for:  # prev C/S for FTP (got to 3-4cm; 2-layer closure) # Rh neg # GBS neg  OB History    Gravida  2   Para  1   Term  1   Preterm      AB      Living  1     SAB      TAB      Ectopic      Multiple      Live Births  1          History reviewed. No pertinent past medical history. Past Surgical History:  Procedure Laterality Date  . CESAREAN SECTION    . WISDOM TOOTH EXTRACTION  2015   Family History: family history includes Hypertension in her father and mother; Pulmonary embolism in her father. Social History:  reports that she has never smoked. She has never used smokeless tobacco. She reports that she does not drink alcohol or use drugs.     Maternal Diabetes: No Genetic Screening: Normal Maternal Ultrasounds/Referrals: Normal Fetal Ultrasounds or other Referrals:  None Maternal Substance Abuse:  No Significant Maternal Medications:  None Significant Maternal Lab Results:  Group B Strep negative and Rh negative Other Comments:  None  ROS History Dilation: 6 Effacement (%): 90 Exam by:: Elray Mcgregor, RN Blood pressure 119/80, pulse 81, temperature 98.1 F (36.7 C), temperature source Oral, resp. rate 16, weight 82.2 kg, last menstrual period 05/17/2018, SpO2 100 %. Exam Physical Exam  Constitutional: She is oriented to person, place, and time. She appears well-developed.  HENT:  Head: Normocephalic.  Neck: Normal range of motion.  Cardiovascular: Normal rate.  Respiratory: Effort normal.  GI:  EFM 150s, +accels, no decels, occ variables Ctx q 3-4 mins  Musculoskeletal: Normal range of motion.  Neurological: She is alert and oriented to person, place, and time.  Skin: Skin is warm and  dry.  Psychiatric: She has a normal mood and affect. Her behavior is normal. Thought content normal.    Prenatal labs: ABO, Rh: O/Negative/-- (03/17 1128) Antibody: Negative (03/17 1128) Rubella: 3.80 (03/17 1128) RPR: Non Reactive (07/09 0922)  HBsAg: Negative (03/17 1128)  HIV: Non Reactive (07/09 0867)  GBS: Negative/-- (09/08 0824)   Assessment/Plan: IUP@39 .4wks TOLAC Active labor Rh neg GBS neg  Admit to Labor and Delivery Expectant management Anticipate successful VBAC Plan for Rhogam eval PP   Myrtis Ser CNM 02/18/2019, 12:44 AM

## 2019-02-18 NOTE — Progress Notes (Signed)
Patient felt the urge to void and was taken to the bathroom with the steady. After a few minutes on the toilet she began to fill dizzy and then proceeded to pass out. RN called for assistance and was taken back to the bed via steady. Fundal rub expressed a small clot with minimal bleeding noted otherwise. Pain medication given. VS WNL. Will continue to monitor. Timoteo Ace, RN

## 2019-02-19 LAB — CBC
HCT: 27.8 % — ABNORMAL LOW (ref 36.0–46.0)
Hemoglobin: 9.1 g/dL — ABNORMAL LOW (ref 12.0–15.0)
MCH: 30 pg (ref 26.0–34.0)
MCHC: 32.7 g/dL (ref 30.0–36.0)
MCV: 91.7 fL (ref 80.0–100.0)
Platelets: 209 10*3/uL (ref 150–400)
RBC: 3.03 MIL/uL — ABNORMAL LOW (ref 3.87–5.11)
RDW: 13.9 % (ref 11.5–15.5)
WBC: 11.6 10*3/uL — ABNORMAL HIGH (ref 4.0–10.5)
nRBC: 0 % (ref 0.0–0.2)

## 2019-02-19 MED ORDER — LACTATED RINGERS IV BOLUS
500.0000 mL | Freq: Once | INTRAVENOUS | Status: AC
  Administered 2019-02-19: 500 mL via INTRAVENOUS

## 2019-02-19 MED ORDER — RHO D IMMUNE GLOBULIN 1500 UNIT/2ML IJ SOSY
300.0000 ug | PREFILLED_SYRINGE | Freq: Once | INTRAMUSCULAR | Status: AC
  Administered 2019-02-19: 18:00:00 300 ug via INTRAVENOUS
  Filled 2019-02-19: qty 2

## 2019-02-19 NOTE — Progress Notes (Addendum)
POSTPARTUM PROGRESS NOTE  Post Partum Day 1  Subjective:  Rebecca [name] is a 28 y.o. F6C1275 s/p VBAC at [redacted]w[redacted]d.  She reports she is doing well. No acute events overnight. She denies any problems with ambulating, voiding or po intake. Denies nausea or vomiting.  Pain is moderately controlled, notes some discomfort with vaginal swelling and edema.  Lochia is appropriate. Reports episode of passing out yesterday afternoon in the bathroom. She has not felt lightheaded since. Has not ambulated yet this AM.   Objective: Blood pressure 95/60, pulse 85, temperature 97.9 F (36.6 C), temperature source Oral, resp. rate 18, height 5\' 6"  (1.676 m), weight 82.2 kg, last menstrual period 05/17/2018, SpO2 99 %, unknown if currently breastfeeding.  Physical Exam:  General: alert, cooperative and no distress Chest: no respiratory distress Heart:regular rate, distal pulses intact Abdomen: soft, nontender,  Uterine Fundus: firm, appropriately tender DVT Evaluation: No calf swelling or tenderness Extremities: No LE edema Skin: warm, dry  Recent Labs    02/18/19 0058  HGB 12.6  HCT 36.5    Assessment/Plan: Rebecca [name] is a 28 y.o. [id number] s/p VBAC at [redacted]w[redacted]d   PPD#1 - Doing well  Routine postpartum care Contraception: Declines  Feeding: Breast  Dispo: Plan for discharge PPD#2.   LOS: 1 day   Phill Myron, D.O. OB Fellow  02/19/2019, 10:13 AM

## 2019-02-19 NOTE — Progress Notes (Signed)
Called by RN due to patient's complaint of dizziness. Patient had successful VBAC on 10/1; EBL 750; patient did not require any uterotonics.  -Patient resting comfortably in bed; VSS (120/72). Has been eating and drinking all day, reports good urine output.  -reports worsening vaginal pain  Assessment:  Patient has left labial hematoma; 3 cm by 3 cm. Tender to palpation, soft, not hardened.   Otherwise well appearing with light lochia.  PLan:  -continue ice to perineum -500 LR fluid bolus -frequent snacking and drinking -check CBC -continue to monitor hematoma

## 2019-02-20 LAB — RH IG WORKUP (INCLUDES ABO/RH)
ABO/RH(D): O NEG
Fetal Screen: NEGATIVE
Gestational Age(Wks): 39.4
Unit division: 0

## 2019-02-20 MED ORDER — IBUPROFEN 600 MG PO TABS: 600.0000 mg | ORAL_TABLET | Freq: Four times a day (QID) | ORAL | 0 refills | Status: AC

## 2019-02-20 NOTE — Lactation Note (Signed)
This note was copied from a baby's chart. Lactation Consultation Note  Patient Name: Rebecca [name] [id number] Date: 02/20/2019 Reason for consult: Follow-up assessment;Term;Hyperbilirubinemia;Infant weight loss  66 hours old FT female who is being exclusively BF by his mother's she;s a P2 and experience BF. Mom and baby may be going home today, depending on TSB taken at 5:45 pm tonight, baby started double phototherapy yesterday but mom hasn't been set up with a DEBP yet; therefore she hasn't begun supplementation.  Mom complaining on some soreness, offered assistance with latch and mom agreed to wake baby up to feed. Reviewed the key points for a deep latch, assisted mom with hand expression and she was able to easily get colostrum out of both breasts, they're just poring off her nipples. Left nipple looked scabbed but they look like dry out colostrum. [place] latched baby on cross cradle position and he was able get a deep latch with a few audible swallows with and without breast comressionnoted throughout the feeding. Baby still nursing at the 27 minutes mark when exiting the room.  Mom willing to begin pumping ASAP, set up a DEBP, instructions, cleaning and storage were reviewed. She'll start pumping today, every 3 hours after feedings. Parents understand the importance of getting baby to have as many bowel movements as possible in order to offset bilirubin levels. Reviewed discharge instructions, engorgement prevention and treatment, treatment/prevention for sore nipples and red flags on when to call baby's pediatrician.  Feeding plan  1. Encouraged mom to keep putting baby to breast 8-12 times/24 hours or sooner if feeding cues are present 2. She'll start pumping every 3 hours after feeding and will spoon feed any amount of colostrum she may get  Parents reported all questions and concerns were answered, they're both aware of [place] OP services and will call PRN.  Maternal Data     Feeding Feeding Type: Breast Fed  LATCH Score Latch: Grasps breast easily, tongue down, lips flanged, rhythmical sucking.(but needed repositioning, he wouldn't get deep enough at first)  Audible Swallowing: A few with stimulation  Type of Nipple: Everted at rest and after stimulation  Comfort (Breast/Nipple): Soft / non-tender  Hold (Positioning): Assistance needed to correctly position infant at breast and maintain latch.  LATCH Score: 8  Interventions Interventions: Breast feeding basics reviewed;Assisted with latch;Skin to skin;Breast massage;Hand express;Breast compression;DEBP;Adjust position;Support pillows  Lactation Tools Discussed/Used Tools: Pump Breast pump type: Double-Electric Breast Pump Pump Review: Setup, frequency, and cleaning Initiated by:: MPeck Date initiated:: 02/20/19   Consult Status Consult Status: Complete Date: 02/20/19 Follow-up type: Call as needed    Demonica Farrey S Kapil Petropoulos 02/20/2019, 4:20 PM

## 2019-02-20 NOTE — Discharge Instructions (Signed)

## 2019-02-22 LAB — BPAM RBC
Blood Product Expiration Date: 202011022359
Blood Product Expiration Date: 202011022359
Unit Type and Rh: 9500
Unit Type and Rh: 9500

## 2019-02-22 LAB — TYPE AND SCREEN
ABO/RH(D): O NEG
Antibody Screen: POSITIVE
Unit division: 0
Unit division: 0

## 2019-02-26 ENCOUNTER — Encounter: Payer: Self-pay | Admitting: Family Medicine

## 2019-02-26 ENCOUNTER — Ambulatory Visit (INDEPENDENT_AMBULATORY_CARE_PROVIDER_SITE_OTHER): Payer: Medicaid Other | Admitting: Family Medicine

## 2019-02-26 ENCOUNTER — Other Ambulatory Visit: Payer: Self-pay

## 2019-02-26 VITALS — BP 116/84 | HR 96 | Ht 66.0 in | Wt 160.0 lb

## 2019-02-26 DIAGNOSIS — K5900 Constipation, unspecified: Secondary | ICD-10-CM | POA: Diagnosis not present

## 2019-02-26 DIAGNOSIS — N898 Other specified noninflammatory disorders of vagina: Secondary | ICD-10-CM

## 2019-02-26 DIAGNOSIS — S30814S Abrasion of vagina and vulva, sequela: Secondary | ICD-10-CM

## 2019-02-26 DIAGNOSIS — S30814A Abrasion of vagina and vulva, initial encounter: Secondary | ICD-10-CM | POA: Diagnosis not present

## 2019-02-26 NOTE — Progress Notes (Signed)
   Subjective:    Patient ID: Rebecca [name], female    DOB: [birth year], 28 y.o.   MRN: [id number]  HPI Patient seen 1 week post partum from vaginal delivery. Had vaginal and labial laceration with left labial hematoma that initially was 3cm x 3cm. She has been using ice at home with ibuprofen and tylenol, which has been helpful. Feels like its getting a little better.   Review of Systems     Objective:   Physical Exam Vitals signs reviewed. Exam conducted with a chaperone present.  Constitutional:      Appearance: Normal appearance.  Genitourinary:   Neurological:     Mental Status: She is alert.  Psychiatric:        Mood and Affect: Mood normal.        Behavior: Behavior normal.        Thought Content: Thought content normal.        Judgment: Judgment normal.        Assessment & Plan:  1. Vaginal hematoma improved  2. Labial abrasion, sequela Silver nitrate applied to abrasion to stimulate healing.  3. Constipation Discussed stool softener

## 2019-03-12 ENCOUNTER — Ambulatory Visit (INDEPENDENT_AMBULATORY_CARE_PROVIDER_SITE_OTHER): Payer: Medicaid Other | Admitting: Family Medicine

## 2019-03-12 ENCOUNTER — Other Ambulatory Visit: Payer: Self-pay

## 2019-03-12 ENCOUNTER — Encounter: Payer: Self-pay | Admitting: Family Medicine

## 2019-03-12 VITALS — BP 108/77 | HR 69 | Ht 66.0 in | Wt 161.0 lb

## 2019-03-12 DIAGNOSIS — S30814S Abrasion of vagina and vulva, sequela: Secondary | ICD-10-CM

## 2019-03-12 DIAGNOSIS — N898 Other specified noninflammatory disorders of vagina: Secondary | ICD-10-CM | POA: Diagnosis not present

## 2019-03-12 NOTE — Progress Notes (Signed)
   Subjective:    Patient ID: Rebecca [name], female    DOB: 10/31/1990, 28 y.o.   MRN: [id number]  HPI  Patient seen for vaginal abrasion. Silver nitrate was applied last week. She reports significantly decreased pain.   Review of Systems     Objective:   Physical Exam Exam conducted with a chaperone present.  Constitutional:      Appearance: Normal appearance.  Genitourinary:    Comments: Abrasion and hematoma healing well. Some firmness to area but improving. No erythema or suspicion of infection. Neurological:     General: No focal deficit present.     Mental Status: She is alert.       Assessment & Plan:  1. Vaginal hematoma 2. Labial abrasion, sequela Both are healing well. Recommended earth Mama's bottom balm to assist in healing. F/u in 2 weeks for PP exam.

## 2019-03-29 ENCOUNTER — Other Ambulatory Visit: Payer: Self-pay

## 2019-03-29 ENCOUNTER — Encounter: Payer: Self-pay | Admitting: Obstetrics & Gynecology

## 2019-03-29 ENCOUNTER — Ambulatory Visit (INDEPENDENT_AMBULATORY_CARE_PROVIDER_SITE_OTHER): Payer: Medicaid Other | Admitting: Obstetrics & Gynecology

## 2019-03-29 DIAGNOSIS — Z1389 Encounter for screening for other disorder: Secondary | ICD-10-CM | POA: Diagnosis not present

## 2019-03-29 NOTE — Progress Notes (Signed)
Subjective:     Rebecca [name] is a 28 y.o. female who presents for a postpartum visit. She is 5 weeks postpartum following a spontaneous vaginal delivery. I have fully reviewed the prenatal and intrapartum course. The delivery was at 13 gestational weeks. Outcome: vaginal birth after cesarean (VBAC). Anesthesia: epidural. Postpartum course was complicated with a hematoma. Baby's course has been uncomplicated.  Baby is feeding by breast. Bleeding no bleeding. Bowel function is normal. Bladder function is normal. Patient is not sexually active. Contraception method is none. Postpartum depression screening: negative.  The following portions of the patient's history were reviewed and updated as appropriate: allergies, current medications, past family history, past medical history, past social history, past surgical history and problem list.  Review of Systems Pertinent items are noted in HPI.   Objective:    LMP 05/17/2018 (Exact Date)   BP 112/80   Pulse 62   Ht 5\' 6"  (1.676 m)   Wt 160 lb (72.6 kg)   LMP 05/17/2018 (Exact Date)   Breastfeeding Yes   BMI 25.82 kg/m   CONSTITUTIONAL: Well-developed, well-nourished female in no acute distress.  HENT:  Normocephalic, atraumatic EYES: Conjunctivae and EOM are normal. No scleral icterus.  NECK: Normal range of motion SKIN: Skin is warm and dry. No rash noted. Not diaphoretic.No pallor. [place]: Alert and oriented to person, place, and time. Normal coordination.  GU: EGBUS: no lesions Vagina: no blood in vault; there is an area on the left mediolateral side that is not fully healed.  It is within the vagina. This was treated with silver nitrate.    Cervix: no lesion; no mucopurulent d/c Uterus: small, mobile Adnexa: no masses; sl tender       Assessment:    5 weeks postpartum exam. Pap smear not done at today's visit.   perineal laceration- incomplete healing  Plan:    1. Contraception: condoms 2.  Follow up in: 4 weeks to  recheck vaginal laceration healing or as needed.   3. The discharge summary says that pt has a PPBTL. This is incorrect. Will send note to have notes amended.    Parisa Pinela L. Harraway-Smith, M.D., [place]

## 2019-04-29 ENCOUNTER — Other Ambulatory Visit (HOSPITAL_COMMUNITY)
Admission: RE | Admit: 2019-04-29 | Discharge: 2019-04-29 | Disposition: A | Discharge status: Home / Self Care | Payer: Medicaid Other | Source: Ambulatory Visit | Attending: Family Medicine | Admitting: Family Medicine

## 2019-04-29 ENCOUNTER — Ambulatory Visit (INDEPENDENT_AMBULATORY_CARE_PROVIDER_SITE_OTHER): Payer: Medicaid Other | Admitting: Obstetrics & Gynecology

## 2019-04-29 ENCOUNTER — Encounter: Payer: Self-pay | Admitting: Obstetrics & Gynecology

## 2019-04-29 ENCOUNTER — Ambulatory Visit: Payer: Medicaid Other | Admitting: Obstetrics & Gynecology

## 2019-04-29 ENCOUNTER — Other Ambulatory Visit: Payer: Self-pay

## 2019-04-29 DIAGNOSIS — N898 Other specified noninflammatory disorders of vagina: Secondary | ICD-10-CM | POA: Diagnosis not present

## 2019-04-29 DIAGNOSIS — N949 Unspecified condition associated with female genital organs and menstrual cycle: Secondary | ICD-10-CM

## 2019-04-29 DIAGNOSIS — R102 Pelvic and perineal pain: Secondary | ICD-10-CM

## 2019-04-29 NOTE — Progress Notes (Signed)
History:  28 y.o. L3Y1017 here today for f/u of perineal laceration and perineal pain. Pt is s/p SVD 02/18/2019. Pt had perineal laceration. She reports that since that time she has had perineal pain. It has gotten better in the past 1-2 days. It has been worse with passing stools. She attempted intercourse and it was terrible painful and they could not proceed. Pt also reports a vaginal odor. She feels like it is present even after bathing.    The following portions of the patient's history were reviewed and updated as appropriate: allergies, current medications, past family history, past medical history, past social history, past surgical history and problem list.  Review of Systems:  Pertinent items are noted in HPI.    Objective:  Physical Exam Last menstrual period 05/17/2018, currently breastfeeding.  CONSTITUTIONAL: Well-developed, well-nourished female in no acute distress.  HENT:  Normocephalic, atraumatic EYES: Conjunctivae and EOM are normal. No scleral icterus.  NECK: Normal range of motion SKIN: Skin is warm and dry. No rash noted. Not diaphoretic.No pallor. [place]: Alert and oriented to person, place, and time. Normal coordination.  Abd: Soft, nontender and nondistended Pelvic: Normal appearing external genitalia; normal appearing vaginal mucosa and cervix.  Normal discharge.  Small uterus, no other palpable masses, no uterine or adnexal tenderness. There is tenderness on the right side of the perineum.     Assessment & Plan:  Perineal pain-   rec NSAIDS prn  rec continued   Vaginal odor  F/u Affirm    Total face-to-face time with patient was 15 min.  Greater than 50% was spent in counseling and coordination of care with the patient.   Terence Bart L. Harraway-Smith, M.D., [place]nn June

## 2019-05-03 LAB — CERVICOVAGINAL ANCILLARY ONLY
Bacterial Vaginitis (gardnerella): NEGATIVE
Candida Glabrata: NEGATIVE
Candida Vaginitis: NEGATIVE
Comment: NEGATIVE
Comment: NEGATIVE
Comment: NEGATIVE
Comment: NEGATIVE
Trichomonas: NEGATIVE

## 2019-05-05 ENCOUNTER — Telehealth: Payer: Self-pay

## 2019-05-05 NOTE — Telephone Encounter (Signed)
Patient called and made aware of negative culture results. Patient states understanding. Kathrene Alu RN

## 2019-05-31 ENCOUNTER — Ambulatory Visit (INDEPENDENT_AMBULATORY_CARE_PROVIDER_SITE_OTHER): Payer: Medicaid Other | Admitting: Obstetrics & Gynecology

## 2019-05-31 ENCOUNTER — Encounter: Payer: Self-pay | Admitting: Obstetrics & Gynecology

## 2019-05-31 ENCOUNTER — Other Ambulatory Visit: Payer: Self-pay

## 2019-05-31 VITALS — BP 113/74 | HR 69 | Ht 66.0 in | Wt 163.0 lb

## 2019-05-31 DIAGNOSIS — O9089 Other complications of the puerperium, not elsewhere classified: Secondary | ICD-10-CM

## 2019-05-31 DIAGNOSIS — R102 Pelvic and perineal pain: Secondary | ICD-10-CM | POA: Diagnosis not present

## 2019-05-31 DIAGNOSIS — N949 Unspecified condition associated with female genital organs and menstrual cycle: Secondary | ICD-10-CM

## 2019-05-31 NOTE — Progress Notes (Signed)
History:  29 y.o. [id number] here today for eval of perineal pain. Pt is s/p SVD with 2nd degree laceration and repair of vaginal lac in Oct 2020. Pt reports that 3 weeks ago she tried to have intercourse and it was still painful. She reports that they did use lubrication. She also reports 'soreness' at the end of the day sometimes. She denies routine pain and no longer has pain with passage of stools but, sometimes feels discomfort after voiding.       The following portions of the patient's history were reviewed and updated as appropriate: allergies, current medications, past family history, past medical history, past social history, past surgical history and problem list.  Review of Systems:  Pertinent items are noted in HPI.    Objective:  Physical Exam Blood pressure 113/74, pulse 69, height 5\' 6"  (1.676 m), weight 163 lb (73.9 kg), currently breastfeeding.  CONSTITUTIONAL: Well-developed, well-nourished female in no acute distress.  HENT:  Normocephalic, atraumatic EYES: Conjunctivae and EOM are normal. No scleral icterus.  NECK: Normal range of motion SKIN: Skin is warm and dry. No rash noted. Not diaphoretic.No pallor. NEUROLGIC: Alert and oriented to Rebecca [name], place, and time. Normal coordination.   Pelvic: Normal appearing external genitalia; the perineum is intact. There are no tender spots and no erythema. Normal appreaing vaginal mucosa and cervix.  Normal discharge.  Small uterus, no other palpable masses, no uterine or adnexal tenderness.    Assessment & Plan:  Perineal pain 3 months post partum. Normal exam   I reviewed vaginismus with pt and proposed ideas for resuming sexual intercourse with her spouse.   Pt will notify me if her problem cont after attempting to resume intercourse.   F/u in 02/2020 or sooner prn  Total face-to-face time with patient was 15 min.  Greater than 50% was spent in counseling and coordination of care with the patient.  Rebecca Rebecca [name] L. Harraway-Smith,  M.D., 03/2020

## 2020-03-11 IMAGING — US US MFM OB COMP + 14 WK
1 series · 13 of 28 positions shown · non-contrast
Comparison: none

[Series 1: us mfm ob comp + 14 wk · 13 of 61 slices shown]
[im 3/61]
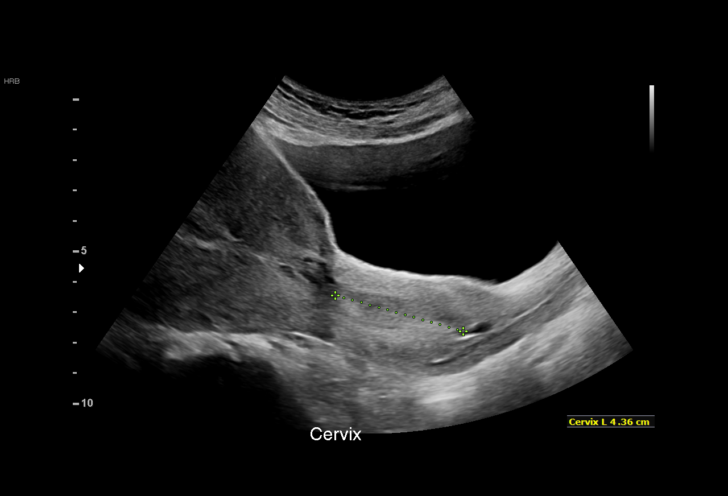
[im 7/61]
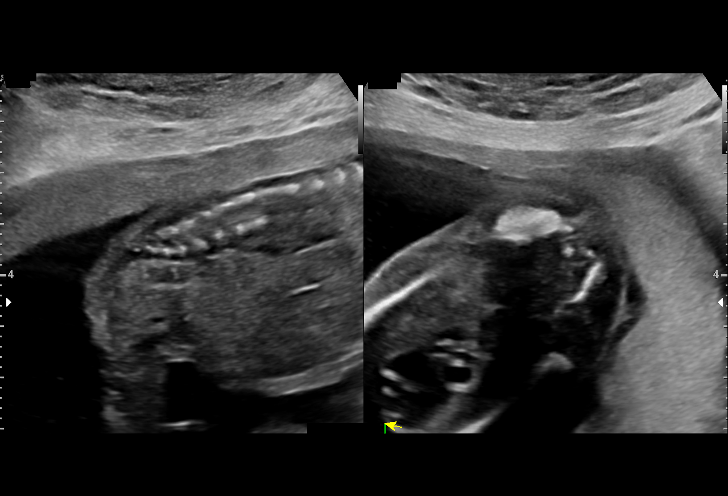
[im 12/61]
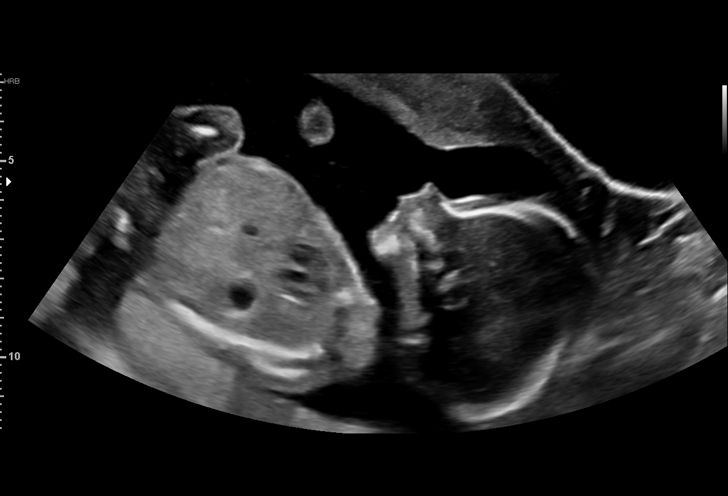
[im 16/61]
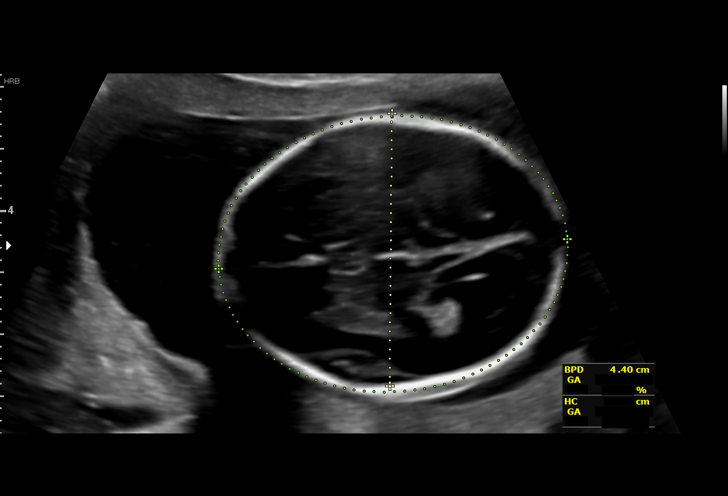
[im 21/61]
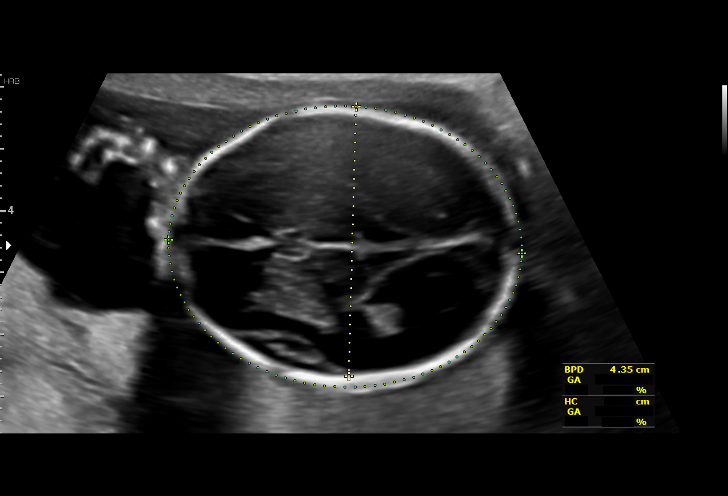
[im 25/61]
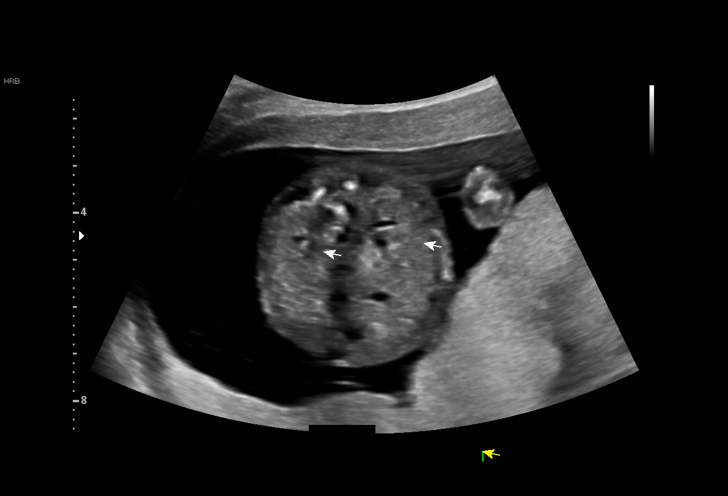
[im 32/61]
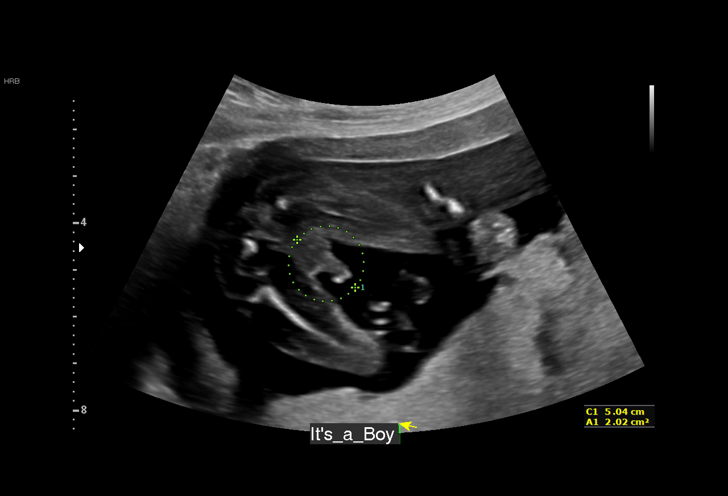
[im 36/61]
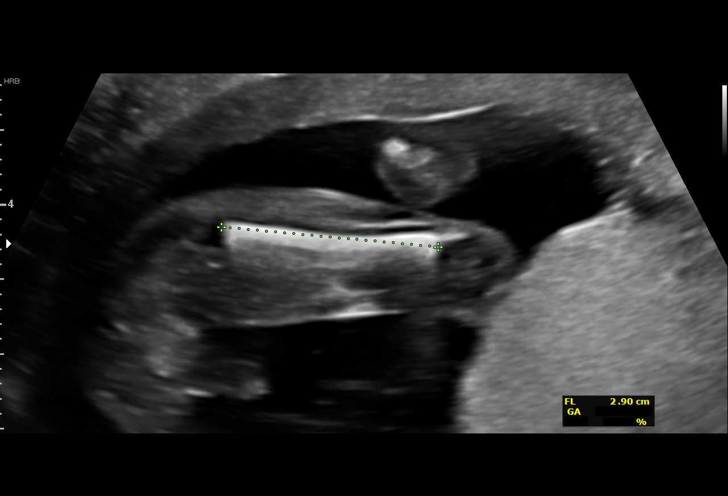
[im 41/61]
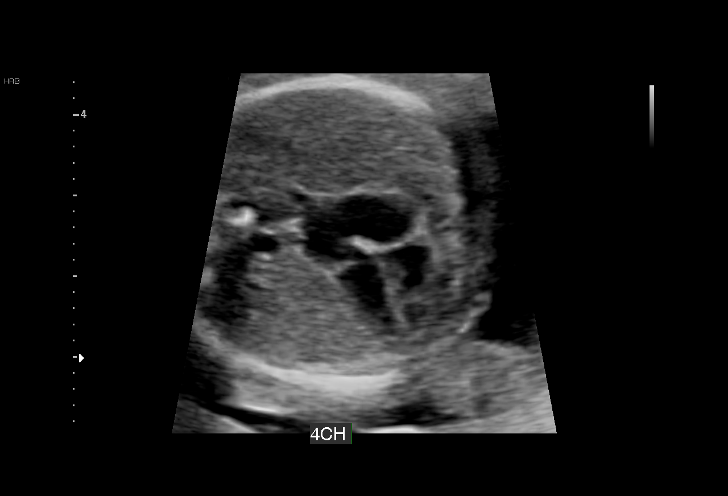
[im 45/61]
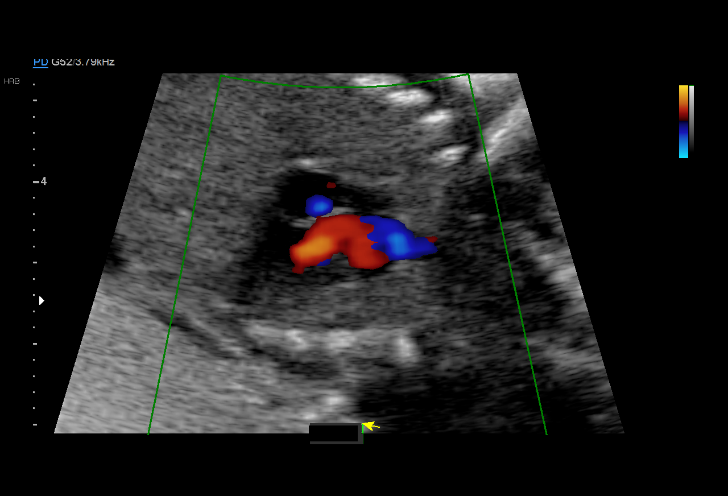
[im 49/61]
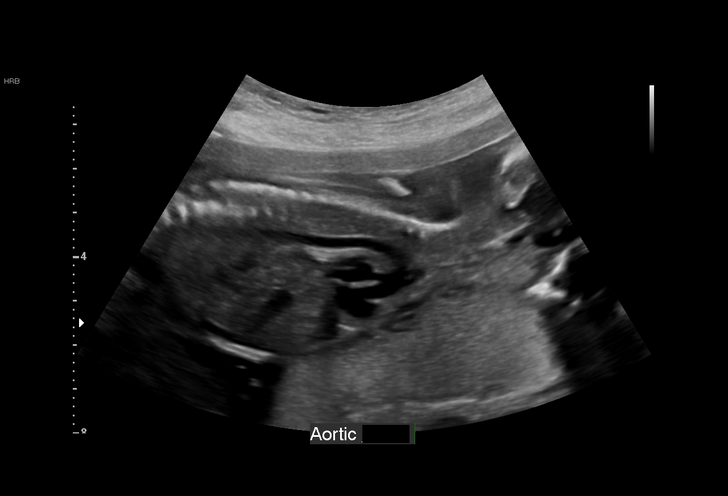
[im 54/61]
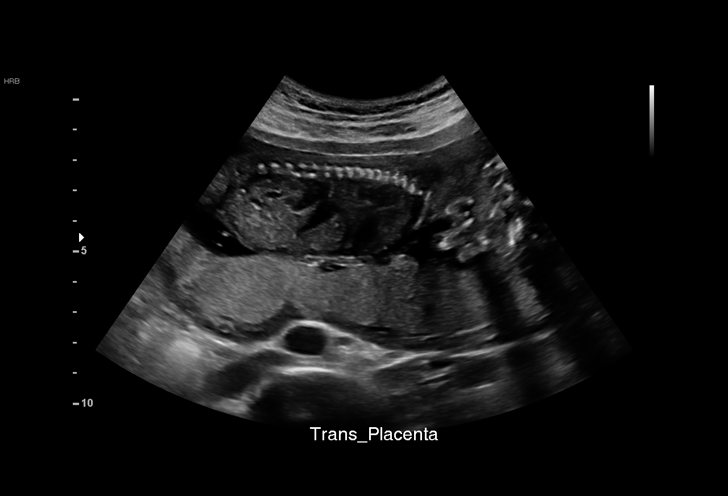
[im 58/61]
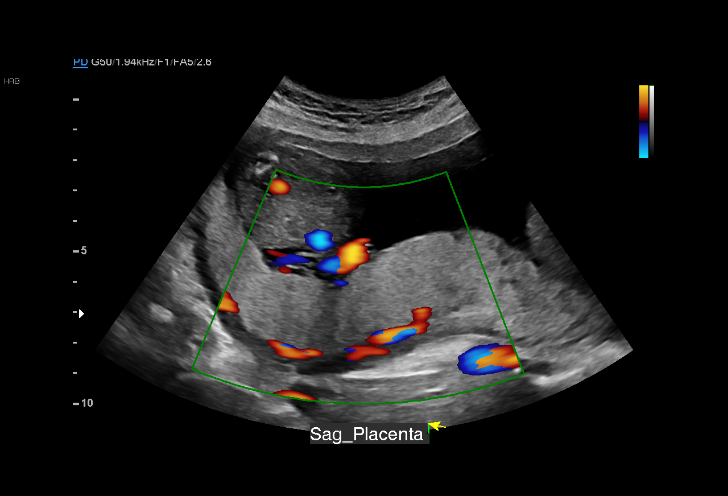

[13 of 28 positions shown; findings below may reference images not displayed]

[REDACTED]
                   [facility] CNM

 ----------------------------------------------------------------------

 ----------------------------------------------------------------------
Indications

  Encounter for antenatal screening for
  malformations (low risk NIPS, nml AFP)
  History of cesarean delivery, currently
  pregnant
  Rh negative state in antepartum
  19 weeks gestation of pregnancy
 ----------------------------------------------------------------------
Vital Signs

 BMI:
Fetal Evaluation

 Num Of Fetuses:         1
 Fetal Heart Rate(bpm):  151
 Cardiac Activity:       Observed
 Presentation:           Transverse, head to maternal left
 Placenta:               Posterior
 P. Cord Insertion:      Visualized, central

 Amniotic Fluid
 AFI FV:      Within normal limits

                             Largest Pocket(cm)

Biometry

 BPD:      43.5  mm     G. Age:  19w 1d         51  %    CI:        72.96   %    70 - 86
                                                         FL/HC:      17.9   %    16.1 -
 HC:      161.9  mm     G. Age:  19w 0d         35  %    HC/AC:      1.06        1.09 -
 AC:      152.1  mm     G. Age:  20w 3d         83  %    FL/BPD:     66.7   %
 FL:         29  mm     G. Age:  18w 6d         35  %    FL/AC:      19.1   %    20 - 24
 CER:      19.4  mm     G. Age:  18w 5d         38  %
 NFT:       3.8  mm

 LV:        6.6  mm
 CM:        3.9  mm

 Est. FW:     304  gm    0 lb 11 oz      53  %
OB History

 Gravidity:    2         Term:   1
 Living:       1
Gestational Age

 LMP:           19w 1d        Date:  05/17/18                 EDD:   02/21/19
 U/S Today:     19w 3d                                        EDD:   02/19/19
 Best:          19w 1d     Det. By:  LMP  (05/17/18)          EDD:   02/21/19
Anatomy

 Cranium:               Appears normal         Aortic Arch:            Appears normal
 Cavum:                 Appears normal         Ductal Arch:            Appears normal
 Ventricles:            Appears normal         Diaphragm:              Not well visualized
 Choroid Plexus:        Appears normal         Stomach:                Appears normal, left
                                                                       sided
 Cerebellum:            Appears normal         Abdomen:                Appears normal
 Posterior Fossa:       Appears normal         Abdominal Wall:         Appears nml (cord
                                                                       insert, abd wall)
 Nuchal Fold:           Appears normal         Cord Vessels:           Appears normal (3
                                                                       vessel cord)
 Face:                  Appears normal         Kidneys:                Appear normal
                        (orbits and profile)
 Lips:                  Appears normal         Bladder:                Appears normal
 Thoracic:              Appears normal         Spine:                  Ltd views no
                                                                       intracranial signs of
                                                                       NTD
 Heart:                 Not well visualized    Upper Extremities:      Appears normal
 RVOT:                  Not well visualized    Lower Extremities:      Appears normal
 LVOT:                  Appears normal

 Other:  Male gender. Heels and 5th digit visualized. Nasal bone visualized.
         Technically difficult due to fetal position and movement
Cervix Uterus Adnexa

 Cervix
 Length:           4.36  cm.
 Normal appearance by transabdominal scan.

 Uterus
 No abnormality visualized.

 Left Ovary
 Not visualized. No adnexal mass visualized.

 Right Ovary
 Not visualized. No adnexal mass visualized.
 Cul De Sac
 No free fluid seen.

 Adnexa
 No abnormality visualized.
Impression

 Normal interval growth.  No ultrasonic evidence of structural
 fetal anomalies.
 Suboptimal views of the fetal anatomy was obtained
 secondary to fetal position.
Recommendations

 Follow up anatomy in 4-6 weeks

## 2020-05-03 ENCOUNTER — Encounter: Payer: Self-pay | Admitting: Family Medicine

## 2020-05-03 ENCOUNTER — Ambulatory Visit (INDEPENDENT_AMBULATORY_CARE_PROVIDER_SITE_OTHER): Payer: Medicaid Other | Admitting: Family Medicine

## 2020-05-03 ENCOUNTER — Other Ambulatory Visit: Payer: Self-pay

## 2020-05-03 VITALS — BP 119/85 | HR 94 | Wt 155.0 lb

## 2020-05-03 DIAGNOSIS — R42 Dizziness and giddiness: Secondary | ICD-10-CM

## 2020-05-03 DIAGNOSIS — E282 Polycystic ovarian syndrome: Secondary | ICD-10-CM | POA: Diagnosis not present

## 2020-05-03 DIAGNOSIS — R102 Pelvic and perineal pain: Secondary | ICD-10-CM

## 2020-05-03 DIAGNOSIS — N949 Unspecified condition associated with female genital organs and menstrual cycle: Secondary | ICD-10-CM

## 2020-05-03 DIAGNOSIS — N939 Abnormal uterine and vaginal bleeding, unspecified: Secondary | ICD-10-CM | POA: Diagnosis not present

## 2020-05-03 NOTE — Progress Notes (Signed)
Subjective:    Patient ID: Rebecca [name], female    DOB: [birth year], 29 y.o.   MRN: [id number]  HPI  G2P2 comes with AUB - 3 cycles close together. Usually has cycles that are between 35-45 days apart (strong family history of PCOS). She reports having COVID in mid October and her next menses was about 2 weeks late. She then had a menses: 11/7-11/12 11/20-11/24 12/12-current. This last menses was heavier.  She has also noticed having some dizzy spells since having COVID - happens about 1-2x per week, but initially more frequent. No headaches, blurred vision.  Also having pain in her perineum since her last vaginal delivery. Had 2nd degree tear with hematoma. Perineal laceration had delayed healing. Feels completely healed, but occasionally has pain if she walks long distances. Pain will last for 3-4 days, then improves. No pain with sex.   Review of Systems     Objective:   Physical Exam Vitals reviewed. Exam conducted with a chaperone present.  Cardiovascular:     Rate and Rhythm: Normal rate and regular rhythm.     Pulses: Normal pulses.  Pulmonary:     Effort: Pulmonary effort is normal.  Abdominal:     Hernia: There is no hernia in the left inguinal area or right inguinal area.  Genitourinary:    Labia:        Right: No rash, tenderness or lesion.        Left: No rash, tenderness or lesion.     Lymphadenopathy:     Lower Body: No right inguinal adenopathy. No left inguinal adenopathy.         Assessment & Plan:  1. Abnormal uterine bleeding (AUB) Possibly due to COVID - some women are experiencing abnormal cycles following COVID vaccine or COVID infection. Check TSH. DIscussed options of start OCPs to regulate cycle. She would like to wait and see if self corrects in the next couple months. F/u in 2-3 months. - TSH  2. Dizziness Likely secondary to COVID. Check TSH, CBC, CMP. - TSH - CBC - Comp Met (CMET)  3. Perineal pain in female Recommended Pelvic floor  PT. She would like to try some exercises on her own prior to seeing PT.  4. PCOS (polycystic ovarian syndrome) Patient likely has PCOS with oligomenorrhea. No evidence of hirsutism.

## 2020-05-04 ENCOUNTER — Encounter: Payer: Self-pay | Admitting: Family Medicine

## 2020-05-04 LAB — CBC
Hematocrit: 44.7 % (ref 34.0–46.6)
Hemoglobin: 15.6 g/dL (ref 11.1–15.9)
MCH: 30.8 pg (ref 26.6–33.0)
MCHC: 34.9 g/dL (ref 31.5–35.7)
MCV: 88 fL (ref 79–97)
Platelets: 282 10*3/uL (ref 150–450)
RBC: 5.07 x10E6/uL (ref 3.77–5.28)
RDW: 12.2 % (ref 11.7–15.4)
WBC: 7.2 10*3/uL (ref 3.4–10.8)

## 2020-05-04 LAB — COMPREHENSIVE METABOLIC PANEL
ALT: 12 IU/L (ref 0–32)
AST: 13 IU/L (ref 0–40)
Albumin/Globulin Ratio: 1.6 (ref 1.2–2.2)
Albumin: 4.6 g/dL (ref 3.9–5.0)
Alkaline Phosphatase: 69 IU/L (ref 44–121)
BUN/Creatinine Ratio: 22 (ref 9–23)
BUN: 19 mg/dL (ref 6–20)
Bilirubin Total: 0.9 mg/dL (ref 0.0–1.2)
CO2: 20 mmol/L (ref 20–29)
Calcium: 9.1 mg/dL (ref 8.7–10.2)
Chloride: 102 mmol/L (ref 96–106)
Creatinine, Ser: 0.87 mg/dL (ref 0.57–1.00)
GFR calc Af Amer: 103 mL/min/{1.73_m2} (ref >59)
GFR calc non Af Amer: 90 mL/min/{1.73_m2} (ref >59)
Globulin, Total: 2.8 g/dL (ref 1.5–4.5)
Glucose: 80 mg/dL (ref 65–99)
Potassium: 4.1 mmol/L (ref 3.5–5.2)
Sodium: 140 mmol/L (ref 134–144)
Total Protein: 7.4 g/dL (ref 6.0–8.5)

## 2020-05-04 LAB — TSH: TSH: 1.64 u[IU]/mL (ref 0.450–4.500)

## 2020-06-07 ENCOUNTER — Telehealth: Payer: Self-pay | Admitting: General Practice

## 2020-06-07 NOTE — Telephone Encounter (Signed)
Left message on VM for patient to give our office a call to scheduled follow up visit with Dr. Adrian Blackwater.

## 2022-07-23 DIAGNOSIS — Z3A36 36 weeks gestation of pregnancy: Secondary | ICD-10-CM | POA: Insufficient documentation

## 2022-07-23 DIAGNOSIS — Z369 Encounter for antenatal screening, unspecified: Secondary | ICD-10-CM | POA: Insufficient documentation

## 2022-07-23 DIAGNOSIS — O9903 Anemia complicating the puerperium: Secondary | ICD-10-CM | POA: Insufficient documentation

## 2023-09-03 ENCOUNTER — Encounter: Payer: Self-pay | Admitting: Family Medicine

## 2023-09-03 ENCOUNTER — Ambulatory Visit (INDEPENDENT_AMBULATORY_CARE_PROVIDER_SITE_OTHER): Admitting: Family Medicine

## 2023-09-03 VITALS — BP 122/82 | HR 81 | Temp 97.6°F | Resp 16 | Ht 66.0 in | Wt 183.0 lb

## 2023-09-03 DIAGNOSIS — R519 Headache, unspecified: Secondary | ICD-10-CM

## 2023-09-03 DIAGNOSIS — E559 Vitamin D deficiency, unspecified: Secondary | ICD-10-CM | POA: Diagnosis not present

## 2023-09-03 DIAGNOSIS — Z87898 Personal history of other specified conditions: Secondary | ICD-10-CM | POA: Insufficient documentation

## 2023-09-03 DIAGNOSIS — Z0001 Encounter for general adult medical examination with abnormal findings: Secondary | ICD-10-CM

## 2023-09-03 DIAGNOSIS — R5382 Chronic fatigue, unspecified: Secondary | ICD-10-CM | POA: Diagnosis not present

## 2023-09-03 DIAGNOSIS — J4520 Mild intermittent asthma, uncomplicated: Secondary | ICD-10-CM

## 2023-09-03 DIAGNOSIS — Z0000 Encounter for general adult medical examination without abnormal findings: Secondary | ICD-10-CM | POA: Insufficient documentation

## 2023-09-03 NOTE — Assessment & Plan Note (Signed)
 Intermittent palpitations without chest pain or dyspnea. Family history of atrial fibrillation. Frequency decreased since January. - Monitor frequency of palpitations. - Consider referral to cardiology for Holter monitoring if palpitations increase.

## 2023-09-03 NOTE — Assessment & Plan Note (Addendum)
-   Labs drawn, Await labs/testing for assessment and recommendations - tetanus vaccine due, encouraged to have vaccine, declined at this time - Flu and covid vaccines declined as well - Encourage regular dental and eye exams. - Encourage diet and exercise   Things to do to keep yourself healthy  - Exercise at least 30-45 minutes a day, 3-4 days a week.  - Eat a low-fat diet with lots of fruits and vegetables, up to 7-9 servings per day.  - Seatbelts can save your life. Wear them always.  - Smoke detectors on every level of your home, check batteries every year.  - Eye Doctor - have an eye exam every 1-2 years  - Safe sex - if you may be exposed to STDs, use a condom.  - Alcohol -  If you drink, do it moderately, less than 2 drinks per day.  - Health Care Power of Attorney. Choose someone to speak for you if you are not able.  - Depression is common in our stressful world.If you're feeling down or losing interest in things you normally enjoy, please come in for a visit.  - Violence - If anyone is threatening or hurting you, please call immediately.

## 2023-09-03 NOTE — Progress Notes (Deleted)
 New Patient Office Visit  Subjective    Patient ID: Rebecca [name], female    DOB: November 18, 1990  Age: 33 y.o. MRN: [id number]  CC:  Chief Complaint  Patient presents with   Establish Care    HPI Rebecca [name] presents to establish care. Patient presents today with desire to get labs drawn and to discuss migraines she is having.   Current Outpatient Medications  Medication Sig Dispense Refill   b complex vitamins capsule Take 1 capsule by mouth daily.     acetaminophen (TYLENOL) 325 MG tablet Take 650 mg by mouth every 6 (six) hours as needed. (Patient not taking: Reported on 09/03/2023)     ibuprofen (ADVIL) 600 MG tablet Take 1 tablet (600 mg total) by mouth every 6 (six) hours. (Patient not taking: Reported on 03/12/2019) 30 tablet 0   No current facility-administered medications for this visit.   Allergies: Patient has no known allergies.  Patient's last menstrual period was 08/31/2023 (exact date).   Outpatient Encounter Medications as of 09/03/2023  Medication Sig   b complex vitamins capsule Take 1 capsule by mouth daily.   acetaminophen (TYLENOL) 325 MG tablet Take 650 mg by mouth every 6 (six) hours as needed. (Patient not taking: Reported on 09/03/2023)   ibuprofen (ADVIL) 600 MG tablet Take 1 tablet (600 mg total) by mouth every 6 (six) hours. (Patient not taking: Reported on 03/12/2019)   [DISCONTINUED] Prenatal Vit-Fe Fumarate-FA (PRENATAL VITAMINS PO) Take 1 tablet by mouth daily. (Patient not taking: Reported on 05/03/2020)   No facility-administered encounter medications on file as of 09/03/2023.    Past Medical History:  Diagnosis Date   Medical history non-contributory     Past Surgical History:  Procedure Laterality Date   CESAREAN SECTION     WISDOM TOOTH EXTRACTION  2015    Family History  Problem Relation Age of Onset   Hypertension Mother    Ovarian cancer Mother    Pulmonary embolism Father    Hypertension Father     Social History    Socioeconomic History   Marital status: Married    Spouse name: Not on file   Number of children: Not on file   Years of education: Not on file   Highest education level: Not on file  Occupational History   Not on file  Tobacco Use   Smoking status: Never   Smokeless tobacco: Never  Vaping Use   Vaping status: Never Used  Substance and Sexual Activity   Alcohol use: Never   Drug use: Never   Sexual activity: Yes    Birth control/protection: None  Other Topics Concern   Not on file  Social History Narrative   Not on file   Social Drivers of Health   Financial Resource Strain: Not on file  Food Insecurity: Not on file  Transportation Needs: Not on file  Physical Activity: Not on file  Stress: Not on file  Social Connections: Not on file  Intimate Partner Violence: Not on file    Review of Systems  Constitutional:  Positive for malaise/fatigue. Negative for chills and fever.  HENT:  Negative for congestion, ear pain, sinus pain and sore throat.   Eyes: Negative.   Respiratory:  Negative for cough, shortness of breath and wheezing.   Cardiovascular:  Positive for palpitations. Negative for chest pain and leg swelling.  Gastrointestinal:  Positive for heartburn and nausea. Negative for constipation, diarrhea and vomiting.  Genitourinary:  Negative for dysuria, frequency and urgency.  Musculoskeletal:  Negative for back pain and falls.  Skin: Negative.   Neurological:  Positive for headaches. Negative for dizziness.  Endo/Heme/Allergies: Negative.         Objective    BP 122/82   Pulse 81   Temp 97.6 F (36.4 C) (Temporal)   Resp 16   Ht 5\' 6"  (1.676 m)   Wt 183 lb (83 kg)   LMP 08/31/2023 (Exact Date)   SpO2 98%   Breastfeeding Yes   BMI 29.54 kg/m   Physical Exam Vitals reviewed.  Constitutional:      Appearance: Normal appearance. She is well-groomed.  HENT:     Head: Normocephalic.     Right Ear: Tympanic membrane, ear canal and external ear  normal.     Left Ear: Tympanic membrane, ear canal and external ear normal.     Nose: Nose normal.     Mouth/Throat:     Mouth: Mucous membranes are moist.     Pharynx: Oropharynx is clear.  Eyes:     Extraocular Movements: Extraocular movements intact.     Conjunctiva/sclera: Conjunctivae normal.     Pupils: Pupils are equal, round, and reactive to light.  Cardiovascular:     Rate and Rhythm: Normal rate and regular rhythm.     Pulses: Normal pulses.     Heart sounds: Normal heart sounds.  Pulmonary:     Effort: Pulmonary effort is normal.     Breath sounds: Normal breath sounds.  Abdominal:     General: Bowel sounds are normal.     Palpations: Abdomen is soft.  Musculoskeletal:        General: Normal range of motion.     Cervical back: Normal range of motion and neck supple.  Skin:    General: Skin is warm and dry.  Neurological:     Mental Status: She is alert and oriented to person, place, and time. Mental status is at baseline.  Psychiatric:        Mood and Affect: Mood normal.        Behavior: Behavior normal.        Thought Content: Thought content normal.        Judgment: Judgment normal.     Last CBC Lab Results  Component Value Date   WBC 7.2 05/03/2020   HGB 15.6 05/03/2020   HCT 44.7 05/03/2020   MCV 88 05/03/2020   MCH 30.8 05/03/2020   RDW 12.2 05/03/2020   PLT 282 05/03/2020   Last metabolic panel Lab Results  Component Value Date   GLUCOSE 80 05/03/2020   NA 140 05/03/2020   K 4.1 05/03/2020   CL 102 05/03/2020   CO2 20 05/03/2020   BUN 19 05/03/2020   CREATININE 0.87 05/03/2020   GFRNONAA 90 05/03/2020   CALCIUM 9.1 05/03/2020   PROT 7.4 05/03/2020   ALBUMIN 4.6 05/03/2020   LABGLOB 2.8 05/03/2020   AGRATIO 1.6 05/03/2020   BILITOT 0.9 05/03/2020   ALKPHOS 69 05/03/2020   AST 13 05/03/2020   ALT 12 05/03/2020   Last lipids No results found for: "CHOL", "HDL", "LDLCALC", "LDLDIRECT", "TRIG", "CHOLHDL" Last thyroid functions Lab  Results  Component Value Date   TSH 1.640 05/03/2020   Last vitamin D No results found for: "25OHVITD2", "25OHVITD3", "VD25OH" Last vitamin B12 and Folate No results found for: "VITAMINB12", "FOLATE"      Assessment & Plan:   Problem List Items Addressed This Visit   None   No follow-ups on file.   Angela  Carolynne Citron, CMA

## 2023-09-03 NOTE — Progress Notes (Signed)
 Subjective:  Patient ID: Rebecca [name], female    DOB: May 18, 1991  Age: 33 y.o. MRN: [id number]  Chief Complaint  Patient presents with   Establish Care     Well Adult Physical: Patient here for a comprehensive physical exam.The patient reports problems - headaches, fatigue Do you take any herbs or supplements that were not prescribed by a doctor? yes Vitamin D and B complex Are you taking calcium supplements? no Are you taking aspirin daily? no  Encounter for general adult medical examination with abnormal findings  Physical ("At Risk" items are starred): Patient's last physical exam was 1 year ago .  Patient is not afflicted from Stress Incontinence and Urge Incontinence  Patient wears a seat belts Patient has smoke detectors and has carbon monoxide detectors. Patient practices appropriate gun safety. Patient wears sunscreen with extended sun exposure. Dental Care: biannual cleanings not done, brushes and flosses daily.  Ophthalmology/Optometry: It's been awhile Hearing loss: none Vision impairments: none  Menarche: 9 Menstrual History: regular LMP: 08/30/23 Pregnancy history: G3P3 Safe at home: yes Self breast exams: NA  Discussed the use of AI scribe software for clinical note transcription with the patient, who gave verbal consent to proceed.  History of Present Illness   Rebecca [name] is a 33 year old female who presents for a new patient visit and annual wellness visit.  She has a history of asthma but has not required her albuterol inhaler recently. She experiences occasional chest tightness, likely due to allergies, but it is not severe enough to necessitate medication.  She experiences headaches that occur randomly and are less severe than those experienced post-COVID. These headaches are often preceded by nausea and sometimes resolve with over-the-counter medications like Tylenol or Excedrin. She notes that insufficient sleep, typically less than 6-7 hours, may  contribute to her headaches.  She experiences occasional palpitations, which were more frequent in January but have since subsided. There is no associated chest pain or shortness of breath. Her mother has a history of atrial fibrillation.  She has a history of low vitamin D levels, which have led to stress fractures in her foot. She is currently taking an over-the-counter vitamin D supplement of 10,000 IU every other day. She has had two stress fractures in her foot in the past year, confirmed by MRI after an initial concern for cancer was raised by an urgent care provider.  She reports feeling lightheaded during exercise, which she attributes to a possible B12 deficiency. She is taking a B complex supplement.  She has a history of allergies but does not take any medication for them. She experiences itchy, watery eyes and occasional ear pain.          No data to display               02/18/2019   12:20 PM 02/18/2019    7:30 PM 02/19/2019    8:30 AM 02/19/2019    8:51 PM 02/20/2019    8:45 AM  Fall Risk  (RETIRED) Patient Fall Risk Level Low fall risk Low fall risk Low fall risk Low fall risk Low fall risk        Social Hx   Social History   Socioeconomic History   Marital status: Married    Spouse name: Not on file   Number of children: Not on file   Years of education: Not on file   Highest education level: Not on file  Occupational History   Not on file  Tobacco Use  Smoking status: Never   Smokeless tobacco: Never  Vaping Use   Vaping status: Never Used  Substance and Sexual Activity   Alcohol use: Never   Drug use: Never   Sexual activity: Yes    Birth control/protection: None  Other Topics Concern   Not on file  Social History Narrative   Not on file   Social Drivers of Health   Financial Resource Strain: Not on file  Food Insecurity: Not on file  Transportation Needs: Not on file  Physical Activity: Not on file  Stress: Not on file  Social Connections:  Not on file   Past Medical History:  Diagnosis Date   Medical history non-contributory    Past Surgical History:  Procedure Laterality Date   CESAREAN SECTION     WISDOM TOOTH EXTRACTION  2015    Family History  Problem Relation Age of Onset   Hypertension Mother    Ovarian cancer Mother    Pulmonary embolism Father    Hypertension Father     Review of Systems  Constitutional:  Positive for fatigue. Negative for chills, diaphoresis and fever.  HENT:  Negative for congestion, ear pain and sinus pain.   Respiratory:  Negative for cough and shortness of breath.   Cardiovascular:  Positive for palpitations. Negative for chest pain.  Gastrointestinal:  Positive for nausea. Negative for abdominal pain, constipation and vomiting.       Heartburn  Genitourinary:  Negative for dysuria.  Musculoskeletal:  Negative for arthralgias.  Neurological:  Positive for headaches. Negative for weakness.  Psychiatric/Behavioral:  Negative for dysphoric mood. The patient is not nervous/anxious.      Objective:  BP 122/82   Pulse 81   Temp 97.6 F (36.4 C) (Temporal)   Resp 16   Ht 5\' 6"  (1.676 m)   Wt 183 lb (83 kg)   LMP 08/31/2023 (Exact Date)   SpO2 98%   Breastfeeding Yes   BMI 29.54 kg/m      09/03/2023    1:44 PM 05/03/2020    3:03 PM 05/31/2019    8:52 AM  BP/Weight  Systolic BP 122 119 113  Diastolic BP 82 85 74  Wt. (Lbs) 183 155 163  BMI 29.54 kg/m2 25.02 kg/m2 26.31 kg/m2    Physical Exam Vitals reviewed.  Constitutional:      General: She is not in acute distress.    Appearance: Normal appearance. She is well-groomed and normal weight. She is not ill-appearing.  HENT:     Head: Normocephalic.     Right Ear: Tympanic membrane, ear canal and external ear normal.     Left Ear: Tympanic membrane, ear canal and external ear normal.     Nose: Nose normal.     Mouth/Throat:     Mouth: Mucous membranes are moist.     Pharynx: Oropharynx is clear. No posterior  oropharyngeal erythema.  Eyes:     Extraocular Movements: Extraocular movements intact.     Conjunctiva/sclera: Conjunctivae normal.     Pupils: Pupils are equal, round, and reactive to light.  Cardiovascular:     Rate and Rhythm: Normal rate and regular rhythm.     Pulses: Normal pulses.     Heart sounds: Normal heart sounds. No murmur heard. Pulmonary:     Effort: Pulmonary effort is normal. No respiratory distress.     Breath sounds: Normal breath sounds. No wheezing.  Chest:  Breasts:    Breasts are symmetrical.     Right: Normal. No tenderness.  Left: Normal. No tenderness.  Abdominal:     General: Bowel sounds are normal.     Palpations: Abdomen is soft. There is no mass.     Tenderness: There is no abdominal tenderness.  Musculoskeletal:        General: Normal range of motion.     Cervical back: Normal range of motion and neck supple.  Lymphadenopathy:     Cervical: No cervical adenopathy.  Skin:    General: Skin is warm and dry.  Neurological:     Mental Status: She is alert and oriented to person, place, and time. Mental status is at baseline.     Cranial Nerves: Cranial nerves 2-12 are intact.     Sensory: Sensation is intact.     Motor: Motor function is intact.     Coordination: Coordination is intact.     Gait: Gait is intact.     Deep Tendon Reflexes: Reflexes are normal and symmetric.  Psychiatric:        Attention and Perception: Attention normal.        Mood and Affect: Mood normal.        Speech: Speech normal.        Behavior: Behavior normal.        Thought Content: Thought content normal.        Cognition and Memory: Cognition normal.        Judgment: Judgment normal.     Lab Results  Component Value Date   WBC 7.2 05/03/2020   HGB 15.6 05/03/2020   HCT 44.7 05/03/2020   PLT 282 05/03/2020   GLUCOSE 80 05/03/2020   ALT 12 05/03/2020   AST 13 05/03/2020   NA 140 05/03/2020   K 4.1 05/03/2020   CL 102 05/03/2020   CREATININE 0.87  05/03/2020   BUN 19 05/03/2020   CO2 20 05/03/2020   TSH 1.640 05/03/2020    Assessment & Plan:  Annual physical exam Assessment & Plan: - Labs drawn, Await labs/testing for assessment and recommendations - tetanus vaccine due, encouraged to have vaccine, declined at this time - Flu and covid vaccines declined as well - Encourage regular dental and eye exams. - Encourage diet and exercise   Things to do to keep yourself healthy  - Exercise at least 30-45 minutes a day, 3-4 days a week.  - Eat a low-fat diet with lots of fruits and vegetables, up to 7-9 servings per day.  - Seatbelts can save your life. Wear them always.  - Smoke detectors on every level of your home, check batteries every year.  - Eye Doctor - have an eye exam every 1-2 years  - Safe sex - if you may be exposed to STDs, use a condom.  - Alcohol -  If you drink, do it moderately, less than 2 drinks per day.  - Health Care Power of Attorney. Choose someone to speak for you if you are not able.  - Depression is common in our stressful world.If you're feeling down or losing interest in things you normally enjoy, please come in for a visit.  - Violence - If anyone is threatening or hurting you, please call immediately.   Orders: -     CBC with Differential/Platelet -     Comprehensive metabolic panel with GFR -     Lipid panel  Vitamin D deficiency Assessment & Plan: Low vitamin D levels with stress fractures. Currently taking 10,000 IU every other day. Prescription strength considered if levels remain  low. - Order vitamin D level. - If vitamin D level is low, prescribe 50,000 IU weekly for 12 weeks and recheck.  Orders: -     VITAMIN D 25 Hydroxy (Vit-D Deficiency, Fractures)  Chronic fatigue -     T4, free -     TSH -     T3, free -     B12 and Folate Panel  Generalized headaches Assessment & Plan: Intermittent headaches with nausea, possibly migraines. Improvement noted post-COVID. Potential causes  include dehydration, stress, allergies, and lack of sleep. Over-the-counter medications effective. - Monitor headache frequency and severity. - Consider non-sedating medications if headaches become unmanageable. - labs drawn, Await labs/testing for assessment and recommendations   Orders: -     B12 and Folate Panel  Mild intermittent asthma without complication Assessment & Plan: Diagnosed as a child and has not had any exacerbations recently - Continue to use albuterol as needed   History of palpitations Assessment & Plan: Intermittent palpitations without chest pain or dyspnea. Family history of atrial fibrillation. Frequency decreased since January. - Monitor frequency of palpitations. - Consider referral to cardiology for Holter monitoring if palpitations increase.    Body mass index is 29.54 kg/m.   These are the goals we discussed:  Goals      Activity and Exercise Increased     Evidence-based guidance:  Review current exercise levels.  Assess patient perspective on exercise or activity level, barriers to increasing activity, motivation and readiness for change.  Recommend or set healthy exercise goal based on individual tolerance.  Encourage small steps toward making change in amount of exercise or activity.  Urge reduction of sedentary activities or screen time.  Promote group activities within the community or with family or support person.  Consider referral to rehabiliation therapist for assessment and exercise/activity plan.   Notes:  Patient would like to feel better and have more energy         This is a list of the screening recommended for you and due dates:  Health Maintenance  Topic Date Due   Hepatitis C Screening  Never done   DTaP/Tdap/Td vaccine (1 - Tdap) Never done   Pneumococcal Vaccination (1 of 2 - PCV) Never done   Pap with HPV screening  08/03/2021   COVID-19 Vaccine (1 - 2024-25 season) Never done   Flu Shot  12/19/2023   HIV  Screening  Completed   HPV Vaccine  Aged Out   Meningitis B Vaccine  Aged Out     No orders of the defined types were placed in this encounter.   Follow-up: Return in about 1 year (around 09/02/2024) for Annual Physical.  An After Visit Summary was printed and given to the patient.  Delford Felling, FNP [facility] [phone]

## 2023-09-03 NOTE — Assessment & Plan Note (Signed)
 Intermittent headaches with nausea, possibly migraines. Improvement noted post-COVID. Potential causes include dehydration, stress, allergies, and lack of sleep. Over-the-counter medications effective. - Monitor headache frequency and severity. - Consider non-sedating medications if headaches become unmanageable. - labs drawn, Await labs/testing for assessment and recommendations

## 2023-09-03 NOTE — Assessment & Plan Note (Signed)
 Low vitamin D levels with stress fractures. Currently taking 10,000 IU every other day. Prescription strength considered if levels remain low. - Order vitamin D level. - If vitamin D level is low, prescribe 50,000 IU weekly for 12 weeks and recheck.

## 2023-09-03 NOTE — Assessment & Plan Note (Signed)
 Diagnosed as a child and has not had any exacerbations recently - Continue to use albuterol as needed

## 2023-09-04 LAB — COMPREHENSIVE METABOLIC PANEL WITH GFR
ALT: 13 IU/L (ref 0–32)
AST: 14 IU/L (ref 0–40)
Albumin: 4.4 g/dL (ref 3.9–4.9)
Alkaline Phosphatase: 90 IU/L (ref 44–121)
BUN/Creatinine Ratio: 22 (ref 9–23)
BUN: 17 mg/dL (ref 6–20)
Bilirubin Total: 0.4 mg/dL (ref 0.0–1.2)
CO2: 22 mmol/L (ref 20–29)
Calcium: 9.3 mg/dL (ref 8.7–10.2)
Chloride: 104 mmol/L (ref 96–106)
Creatinine, Ser: 0.76 mg/dL (ref 0.57–1.00)
Globulin, Total: 2.6 g/dL (ref 1.5–4.5)
Glucose: 86 mg/dL (ref 70–99)
Potassium: 5 mmol/L (ref 3.5–5.2)
Sodium: 139 mmol/L (ref 134–144)
Total Protein: 7 g/dL (ref 6.0–8.5)
eGFR: 105 mL/min/{1.73_m2} (ref >59)

## 2023-09-04 LAB — CBC WITH DIFFERENTIAL/PLATELET
Basophils Absolute: 0.1 10*3/uL (ref 0.0–0.2)
Basos: 1 %
EOS (ABSOLUTE): 0.2 10*3/uL (ref 0.0–0.4)
Eos: 4 %
Hematocrit: 45 % (ref 34.0–46.6)
Hemoglobin: 14.6 g/dL (ref 11.1–15.9)
Immature Grans (Abs): 0 10*3/uL (ref 0.0–0.1)
Immature Granulocytes: 0 %
Lymphocytes Absolute: 1.8 10*3/uL (ref 0.7–3.1)
Lymphs: 31 %
MCH: 29 pg (ref 26.6–33.0)
MCHC: 32.4 g/dL (ref 31.5–35.7)
MCV: 89 fL (ref 79–97)
Monocytes Absolute: 0.4 10*3/uL (ref 0.1–0.9)
Monocytes: 6 %
Neutrophils Absolute: 3.4 10*3/uL (ref 1.4–7.0)
Neutrophils: 58 %
Platelets: 283 10*3/uL (ref 150–450)
RBC: 5.04 x10E6/uL (ref 3.77–5.28)
RDW: 12.9 % (ref 11.7–15.4)
WBC: 5.9 10*3/uL (ref 3.4–10.8)

## 2023-09-04 LAB — T4, FREE: Free T4: 1.25 ng/dL (ref 0.82–1.77)

## 2023-09-04 LAB — VITAMIN D 25 HYDROXY (VIT D DEFICIENCY, FRACTURES): Vit D, 25-Hydroxy: 32.4 ng/mL (ref 30.0–100.0)

## 2023-09-04 LAB — LIPID PANEL
Chol/HDL Ratio: 3 ratio (ref 0.0–4.4)
Cholesterol, Total: 192 mg/dL (ref 100–199)
HDL: 63 mg/dL (ref >39)
LDL Chol Calc (NIH): 121 mg/dL — ABNORMAL HIGH (ref 0–99)
Triglycerides: 43 mg/dL (ref 0–149)
VLDL Cholesterol Cal: 8 mg/dL (ref 5–40)

## 2023-09-04 LAB — T3, FREE: T3, Free: 2.8 pg/mL (ref 2.0–4.4)

## 2023-09-04 LAB — B12 AND FOLATE PANEL
Folate: 8.2 ng/mL (ref >3.0)
Vitamin B-12: 802 pg/mL (ref 232–1245)

## 2023-09-04 LAB — TSH: TSH: 0.895 u[IU]/mL (ref 0.450–4.500)
